# Patient Record
Sex: Male | Born: 1945
Health system: Southern US, Community
[De-identification: ages and names within clinical notes are randomized; demographics above are authoritative.]

## PROBLEM LIST (undated history)

## (undated) DIAGNOSIS — F1010 Alcohol abuse, uncomplicated: Secondary | ICD-10-CM

## (undated) DIAGNOSIS — R1310 Dysphagia, unspecified: Secondary | ICD-10-CM

## (undated) DIAGNOSIS — E43 Unspecified severe protein-calorie malnutrition: Secondary | ICD-10-CM

## (undated) DIAGNOSIS — J690 Pneumonitis due to inhalation of food and vomit: Secondary | ICD-10-CM

---

## 2013-05-18 ENCOUNTER — Inpatient Hospital Stay
Admission: RE | Admit: 2013-05-18 | Discharge: 2013-07-02 | Disposition: A | Discharge status: Skilled Nursing Facility | Payer: Medicare Other | Source: Other Acute Inpatient Hospital | Attending: Internal Medicine | Admitting: Internal Medicine

## 2013-05-18 DIAGNOSIS — Z9911 Dependence on respirator [ventilator] status: Secondary | ICD-10-CM

## 2013-05-18 DIAGNOSIS — E43 Unspecified severe protein-calorie malnutrition: Secondary | ICD-10-CM

## 2013-05-18 DIAGNOSIS — S270XXA Traumatic pneumothorax, initial encounter: Secondary | ICD-10-CM

## 2013-05-18 DIAGNOSIS — J90 Pleural effusion, not elsewhere classified: Secondary | ICD-10-CM

## 2013-05-18 DIAGNOSIS — F1010 Alcohol abuse, uncomplicated: Secondary | ICD-10-CM | POA: Diagnosis present

## 2013-05-18 DIAGNOSIS — J9620 Acute and chronic respiratory failure, unspecified whether with hypoxia or hypercapnia: Secondary | ICD-10-CM | POA: Diagnosis present

## 2013-05-18 DIAGNOSIS — Z930 Tracheostomy status: Secondary | ICD-10-CM

## 2013-05-18 DIAGNOSIS — R29898 Other symptoms and signs involving the musculoskeletal system: Secondary | ICD-10-CM | POA: Diagnosis present

## 2013-05-18 DIAGNOSIS — J690 Pneumonitis due to inhalation of food and vomit: Secondary | ICD-10-CM | POA: Diagnosis present

## 2013-05-18 HISTORY — DX: Pneumonitis due to inhalation of food and vomit: J69.0

## 2013-05-18 HISTORY — DX: Unspecified severe protein-calorie malnutrition: E43

## 2013-05-18 HISTORY — DX: Alcohol abuse, uncomplicated: F10.10

## 2013-05-18 HISTORY — DX: Dysphagia, unspecified: R13.10

## 2013-05-19 ENCOUNTER — Institutional Professional Consult (permissible substitution) (HOSPITAL_COMMUNITY): Payer: Medicare Other

## 2013-05-19 LAB — BASIC METABOLIC PANEL
BUN: 13 mg/dL (ref 6–23)
CALCIUM: 9.1 mg/dL (ref 8.4–10.5)
CO2: 37 meq/L — AB (ref 19–32)
Chloride: 97 mEq/L (ref 96–112)
Creatinine, Ser: 0.4 mg/dL — ABNORMAL LOW (ref 0.50–1.35)
GFR calc Af Amer: >90 mL/min (ref >90)
GFR calc non Af Amer: >90 mL/min (ref >90)
Glucose, Bld: 113 mg/dL — ABNORMAL HIGH (ref 70–99)
Potassium: 3.9 mEq/L (ref 3.7–5.3)
Sodium: 141 mEq/L (ref 137–147)

## 2013-05-19 LAB — CBC
HEMATOCRIT: 38.7 % — AB (ref 39.0–52.0)
Hemoglobin: 12.6 g/dL — ABNORMAL LOW (ref 13.0–17.0)
MCH: 32.2 pg (ref 26.0–34.0)
MCHC: 32.6 g/dL (ref 30.0–36.0)
MCV: 99 fL (ref 78.0–100.0)
PLATELETS: 229 10*3/uL (ref 150–400)
RBC: 3.91 MIL/uL — ABNORMAL LOW (ref 4.22–5.81)
RDW: 13 % (ref 11.5–15.5)
WBC: 4.8 10*3/uL (ref 4.0–10.5)

## 2013-05-19 LAB — TSH: TSH: 1.67 u[IU]/mL (ref 0.350–4.500)

## 2013-05-19 NOTE — Progress Notes (Signed)
[facility]                                                                                              Progress note     Patient Demographics  Brandon Skinner Wortley, is a 68 y.o. male  ZOX:[id number]  WJX:[id number]:[id number]  DOB - 03/26/1945  Admit date - 05/18/2013  Admitting Physician Carron CurieAli Arthuro Canelo, MD  Outpatient Primary MD for the patient is PROVIDER NOT IN SYSTEM  LOS - 1   CC   Resp Failure   Dysphagia         Subjective:   Brandon Skinner Kahan today has, No headache, No chest pain, + abdominal pain - No Nausea, No new weakness tingling or numbness, No Cough - SOB. + Insomnia  Objective:   Vital signs  Temperature Heart rate Respiratory rate Blood pressure Pulse ox    Exam Awake Alert, Oriented X 3, No new F.N deficits, Normal affect [place].AT,PERRAL NGT In Place Supple Neck,No JVD, No cervical lymphadenopathy appriciated.  Symmetrical Chest wall movement, Good air movement bilaterally, CTAB RRR,No Gallops,Rubs or new Murmurs, No Parasternal Heave +ve B.Sounds, Abd Soft, Periumbilical Tenderness, No organomegaly appriciated, No rebound - guarding or rigidity. No Cyanosis, Clubbing or edema, No new Rash or bruise    I&Os NG   Data Review   CBC  Recent Labs Lab 05/19/13 0446  WBC 4.8  HGB 12.6*  HCT 38.7*  PLT 229  MCV 99.0  MCH 32.2  MCHC 32.6  RDW 13.0    Chemistries   Recent Labs Lab 05/19/13 0446  NA 141  K 3.9  CL 97  CO2 37*  GLUCOSE 113*  BUN 13  CREATININE 0.40*  CALCIUM 9.1        Assessment & Plan   Resp Failure B/L Asp Pna ; on Levaquin and Flagyl Dysphagia Hypernatremia Deconditioning Alcoholism PCM Insomnia  Code Status: Full    DVT Prophylaxis Heparin   Carron CurieAli Chelsie Burel M.D on 05/19/2013 at 3:39 PM

## 2013-05-20 ENCOUNTER — Other Ambulatory Visit (HOSPITAL_COMMUNITY): Payer: Medicare Other

## 2013-05-20 ENCOUNTER — Encounter: Payer: Self-pay | Admitting: Pulmonary Disease

## 2013-05-20 DIAGNOSIS — F1010 Alcohol abuse, uncomplicated: Secondary | ICD-10-CM

## 2013-05-20 DIAGNOSIS — Z9911 Dependence on respirator [ventilator] status: Secondary | ICD-10-CM

## 2013-05-20 DIAGNOSIS — R29898 Other symptoms and signs involving the musculoskeletal system: Secondary | ICD-10-CM | POA: Diagnosis present

## 2013-05-20 DIAGNOSIS — E43 Unspecified severe protein-calorie malnutrition: Secondary | ICD-10-CM

## 2013-05-20 DIAGNOSIS — J690 Pneumonitis due to inhalation of food and vomit: Secondary | ICD-10-CM | POA: Diagnosis present

## 2013-05-20 DIAGNOSIS — J9620 Acute and chronic respiratory failure, unspecified whether with hypoxia or hypercapnia: Secondary | ICD-10-CM | POA: Diagnosis present

## 2013-05-20 DIAGNOSIS — R29818 Other symptoms and signs involving the nervous system: Secondary | ICD-10-CM

## 2013-05-20 LAB — BLOOD GAS, ARTERIAL
Acid-Base Excess: 10.2 mmol/L — ABNORMAL HIGH (ref 0.0–2.0)
Bicarbonate: 36.3 mEq/L — ABNORMAL HIGH (ref 20.0–24.0)
FIO2: 0.5 %
O2 Saturation: 88.4 %
PH ART: 7.333 — AB (ref 7.350–7.450)
PO2 ART: 58.4 mmHg — AB (ref 80.0–100.0)
Patient temperature: 98.6
TCO2: 38.4 mmol/L (ref 0–100)
pCO2 arterial: 70.3 mmHg (ref 35.0–45.0)

## 2013-05-20 NOTE — Procedures (Signed)
Intubation Procedure Note Mickel FuchsFredrick L Consalvo [id number] [birth year]  Procedure: Intubation Indications: Respiratory insufficiency  Procedure Details Consent: Risks of procedure as well as the alternatives and risks of each were explained to the (patient/caregiver).  Consent for procedure obtained. Time Out: Verified patient identification, verified procedure, site/side was marked, verified correct patient position, special equipment/implants available, medications/allergies/relevent history reviewed, required imaging and test results available.  Performed  Maximum sterile technique was used including antiseptics, gloves, hand hygiene and mask.  MAC and 3  Given 4 mg versed, 50 mcg fentanyl, 10 mg etomidate.  Used glidescope.  Inserted #8 ETT to 25 cm.  Evaluation Hemodynamic Status: BP stable throughout; O2 sats: stable throughout Patient's Current Condition: stable Complications: No apparent complications Patient did tolerate procedure well. Chest X-ray ordered to verify placement.  CXR: pending.   Performed by Devra DoppSteve Minor, ACNP.  I was present for procedure.  Coralyn HellingVineet Trinty Marken, MD [facility] 05/20/2013, 9:47 AM Pager:  [phone] After 3pm call: [phone]

## 2013-05-20 NOTE — Consult Note (Signed)
Name: Brandon Skinner MRN: [id number] DOB: 1945/09/20    ADMISSION DATE:  05/18/2013 CONSULTATION DATE:  05/21/2103  REFERRING MD :  Carron CurieAli Hijazi  CHIEF COMPLAINT:  Short of breath  BRIEF PATIENT DESCRIPTION:  68 yr old male with hx of TOH was [facility].  He was transferred to medical facility with hypoxia and pneumonia likely from aspiration with dysphagia.  [facility]  PCCM consulted 05/20/2013 due to respiratory failure.  SIGNIFICANT EVENTS: 4/19 [facility] VDRF  STUDIES:   LINES / TUBES: ETT 4/21 >>   CULTURES: Sputum 4/21 >>   ANTIBIOTICS: Levaquin  HISTORY OF PRESENT ILLNESS:   68 yo male with hx of ETOH went to [facility] for detox.  He was noted to have dysphagia.  He developed hypoxia from aspiration.  [facility]  He developed recurrent aspiration with progressive respiratory failure.  He was tried on BiPAP, but this did not help.  He subsequently required intubation.  PAST MEDICAL HISTORY :  Past Medical History  Diagnosis Date  . ETOH abuse   . Severe protein-calorie malnutrition   . Dysphagia   . Aspiration pneumonia    No past surgical history on file.  Med list reviewed in bedside chart.  Allergies  Allergen Reactions  . Penicillins     FAMILY HISTORY:  Family History  Problem Relation Age of Onset  . Family history unknown: Yes   SOCIAL HISTORY:  reports that he drinks alcohol. His tobacco and drug histories are not on file.  REVIEW OF SYSTEMS:   Unable to obtain  SUBJECTIVE:   VITAL SIGNS: Reviewed in bedside chart.  PHYSICAL EXAMINATION: General:  Ill appearing,  cachectic Neuro:  Confused, moves extremities, mumbles HEENT:  Temporal wasting, feeding tube in nares, dry oral mucosa Cardiovascular:  Regular, tachycardic Lungs:  B/l rhonchi, poor air movement, using accessory muscles Abdomen:  Soft, thin, decreased bowel sounds Musculoskeletal:  Decreased muscle bulk Skin:   No rash  CBC Recent Labs     05/19/13  0446  WBC  4.8  HGB  12.6*  HCT  38.7*  PLT  229    BMET Recent Labs     05/19/13  0446  NA  141  K  3.9  CL  97  CO2  37*  BUN  13  CREATININE  0.40*  GLUCOSE  113*    Electrolytes Recent Labs     05/19/13  0446  CALCIUM  9.1    ABG Recent Labs     05/20/13  0617  PHART  7.333*  PCO2ART  70.3*  PO2ART  58.4*   Imaging Dg Chest Port 1 View  05/19/2013   CLINICAL DATA:  Respiratory failure  EXAM: PORTABLE CHEST - 1 VIEW  COMPARISON:  DG CHEST 1V dated 05/17/2013; CT ANGIO CHEST dated 05/12/2013  FINDINGS: The lungs are hyperinflated likely secondary to COPD. There is a enteric tube coursing below the diaphragm with the tip excluded from the field of view. Bilateral trace pleural effusions, left greater than right. Bibasilar airspace disease, left greater than right which may reflect atelectasis versus pneumonia. Stable cardiomediastinal silhouette.  The osseous structures are unremarkable.  IMPRESSION: Stable overall findings. Bilateral small pleural effusions and bibasilar airspace disease which may reflect atelectasis versus pneumonia.   Electronically Signed   By: Elige KoHetal  Patel   On: 05/19/2013 07:59   Dg Abd Portable 1v  05/20/2013   CLINICAL DATA:  Check feeding tube position.  EXAM: PORTABLE ABDOMEN - 1 VIEW  COMPARISON:  05/16/2013  FINDINGS: Feeding tube tip is in the upper mid abdomen consistent with location in the distal stomach. Bowel gas pattern is normal with residual contrast and stool in the colon.  IMPRESSION: Feeding tube tip localizes to the lobe distal stomach.   Electronically Signed   By: Burman NievesWilliam  Stevens M.D.   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 P: -full vent support -Abx per primary team -add scheduled BD's -f/u CXR, ABG -sedation protocol while on vent  A: Severe protein  calorie malnutrition. Dysphagia. P: -continue tube feeds >> was recommend to have PEG inserted at outside hospital -protonix for SUP  A: Hx of ETOH. P: -per primary team  A: Deconditioning. P: -PT/OT per primary team  Spoke with Pt's daughter Lorelle FormosaHanna over the phone.  Updated her about her father's status.  She confirmed that her father requested aggressive measures, including intubation and cardiac resuscitation if needed.  D/w Dr. Sharyon MedicusHijazi.  CC time 60 minutes.  Coralyn HellingVineet Laira Penninger, MD [facility] 05/20/2013, 10:09 AM Pager:  [phone] After 3pm call: [phone]

## 2013-05-20 NOTE — Progress Notes (Signed)
[facility]                                                                                              Progress note     Patient Demographics  Brandon Skinner, is a 68 y.o. male  WNU:[id number]  IHK:[id number]  DOB - 30-Sep-1945  Admit date - 05/18/2013  Admitting Physician Carron CurieAli Emmer Lillibridge, MD  Outpatient Primary MD for the patient is PROVIDER NOT IN SYSTEM  LOS - 2   CC   Resp Failure   Dysphagia     Aspiration pneumonia       Subjective:   Brandon Skinner unable to give any history  Objective:   Vital signs  Temperature 97.6 Heart rate 83 Respiratory rate 18  Blood pressure 125/75 Pulse ox 98%    Exam Obtunded ,  [place].AT,PERRAL NGT In Place Supple Neck,No JVD, No cervical lymphadenopathy appriciated. ET tube in place Symmetrical Chest wall movement, decreased breath sounds bilaterally especially at the bases RRR,No Gallops,Rubs or new Murmurs, No Parasternal Heave +ve B.Sounds, Abd Soft, Periumbilical Tenderness, No organomegaly appriciated, No rebound - guarding or rigidity. No Cyanosis, Clubbing or edema, No new Rash or bruise    I&Os 1350/40 NG yes   Data Review   CBC  Recent Labs Lab 05/19/13 0446  WBC 4.8  HGB 12.6*  HCT 38.7*  PLT 229  MCV 99.0  MCH 32.2  MCHC 32.6  RDW 13.0    Chemistries   Recent Labs Lab 05/19/13 0446  NA 141  K 3.9  CL 97  CO2 37*  GLUCOSE 113*  BUN 13  CREATININE 0.40*  CALCIUM 9.1        Assessment & Plan   Resp Failure, now intubated continue with TV first 20, respiratory rate 16 FiO2 100% and PEEP of 5. B/L Asp Pna/HCAPS ; on vancomycin and Zosyn  Dysphagia continue with NG tube feeding Hypernatremia resolved Deconditioning hold PT OT today and resume in a.m. Alcoholism continue with thiamine/folate PCM continue with tube feeding and NG tube Insomnia continue with Ativan at night  Plan Check labs and  chest x-ray in a.m. And vancomycin IV for HCAPS  Code Status: Full    DVT Prophylaxis Heparin   Carron CurieAli Lamoyne Palencia M.D on 05/20/2013 at 1:38 PM

## 2013-05-21 ENCOUNTER — Other Ambulatory Visit (HOSPITAL_COMMUNITY): Payer: Medicare Other

## 2013-05-21 LAB — BLOOD GAS, ARTERIAL
Acid-Base Excess: 7.8 mmol/L — ABNORMAL HIGH (ref 0.0–2.0)
Acid-Base Excess: 7 mmol/L — ABNORMAL HIGH (ref 0.0–2.0)
Bicarbonate: 30.8 mEq/L — ABNORMAL HIGH (ref 20.0–24.0)
Bicarbonate: 30.6 mEq/L — ABNORMAL HIGH (ref 20.0–24.0)
FIO2: 60 %
FIO2: 0.4 %
LHR: 16 {breaths}/min
MECHVT: 620 mL
MECHVT: 550 mL
O2 SAT: 99.9 %
O2 SAT: 96.7 %
PCO2 ART: 35.5 mmHg (ref 35.0–45.0)
PCO2 ART: 40.3 mmHg (ref 35.0–45.0)
PEEP/CPAP: 5 cmH2O
PEEP/CPAP: 5 cmH2O
PO2 ART: 79.3 mmHg — AB (ref 80.0–100.0)
Patient temperature: 98.7
Patient temperature: 98.6
RATE: 16 resp/min
TCO2: 31.9 mmol/L (ref 0–100)
TCO2: 31.8 mmol/L (ref 0–100)
pH, Arterial: 7.548 — ABNORMAL HIGH (ref 7.350–7.450)
pH, Arterial: 7.492 — ABNORMAL HIGH (ref 7.350–7.450)
pO2, Arterial: 116 mmHg — ABNORMAL HIGH (ref 80.0–100.0)

## 2013-05-21 LAB — CBC
HEMATOCRIT: 33.3 % — AB (ref 39.0–52.0)
HEMOGLOBIN: 11.3 g/dL — AB (ref 13.0–17.0)
MCH: 32.2 pg (ref 26.0–34.0)
MCHC: 33.9 g/dL (ref 30.0–36.0)
MCV: 94.9 fL (ref 78.0–100.0)
Platelets: 243 10*3/uL (ref 150–400)
RBC: 3.51 MIL/uL — AB (ref 4.22–5.81)
RDW: 13.2 % (ref 11.5–15.5)
WBC: 12.6 10*3/uL — ABNORMAL HIGH (ref 4.0–10.5)

## 2013-05-21 LAB — BASIC METABOLIC PANEL
BUN: 20 mg/dL (ref 6–23)
CALCIUM: 8.6 mg/dL (ref 8.4–10.5)
CO2: 27 meq/L (ref 19–32)
CREATININE: 0.58 mg/dL (ref 0.50–1.35)
Chloride: 101 mEq/L (ref 96–112)
GFR calc Af Amer: >90 mL/min (ref >90)
GFR calc non Af Amer: >90 mL/min (ref >90)
GLUCOSE: 124 mg/dL — AB (ref 70–99)
Potassium: 3.6 mEq/L — ABNORMAL LOW (ref 3.7–5.3)
Sodium: 139 mEq/L (ref 137–147)

## 2013-05-21 NOTE — Progress Notes (Signed)
[facility]                                                                                              Progress note     Patient Demographics  Brandon Skinner, is a 68 y.o. male  AVW:[id number]  WGN:[id number]  DOB - 1945/07/04  Admit date - 05/18/2013  Admitting Physician Carron CurieAli Jim Philemon, MD  Outpatient Primary MD for the patient is PROVIDER NOT IN SYSTEM  LOS - 3   CC   Resp Failure   Dysphagia     Aspiration pneumonia       Subjective:   Brandon CoryFredrick Mikkelson unable to give any history due to to intubation   Objective:   Vital signs  Temperature 97.6 Heart rate  85  Respiratory rate  60   Blood pressure  116/65  Pulse ox  96%     Exam Obtunded ,  [place].AT,PERRAL NGT In Place Supple Neck,No JVD, No cervical lymphadenopathy appriciated. ET tube in place Symmetrical Chest wall movement, decreased breath sounds bilaterally especially at the bases RRR,No Gallops,Rubs or new Murmurs, No Parasternal Heave +ve B.Sounds, Abd Soft, Periumbilical Tenderness, No organomegaly appriciated, No rebound - guarding or rigidity. No Cyanosis, Clubbing or edema, No new Rash or bruise    I&Os  2462/850  NG yes   Data Review   CBC  Recent Labs Lab 05/19/13 0446 05/21/13 0700  WBC 4.8 12.6*  HGB 12.6* 11.3*  HCT 38.7* 33.3*  PLT 229 243  MCV 99.0 94.9  MCH 32.2 32.2  MCHC 32.6 33.9  RDW 13.0 13.2    Chemistries   Recent Labs Lab 05/19/13 0446 05/21/13 0700  NA 141 139  K 3.9 3.6*  CL 97 101  CO2 37* 27  GLUCOSE 113* 124*  BUN 13 20  CREATININE 0.40* 0.58  CALCIUM 9.1 8.6        Assessment & Plan   Resp Failure, now intubated continue with ET tube per PCCM B/L Asp Pna/HCAPS ; on vancomycin and Zosyn  Dysphagia continue with NG tube feeding Hypernatremia resolved Deconditioning hold PT OT today and resume in a.m. Alcoholism continue with thiamine/folate PCM continue  with tube feeding and NG tube Insomnia continue with Ativan at night  Plan  DC vancomycin and Unasyn  Check labs portable chest x-ray in a.m.  Code Status: Full    DVT Prophylaxis Heparin   Carron CurieAli Jeree Delcid M.D on 05/21/2013 at 6:21 PM

## 2013-05-21 NOTE — Consult Note (Signed)
Name: Brandon FuchsFredrick L Skinner MRN: [id number] DOB: August 26, 1945    ADMISSION DATE:  05/18/2013 CONSULTATION DATE:  05/21/2103  REFERRING MD :  Carron CurieAli Hijazi  CHIEF COMPLAINT:  Short of breath  BRIEF PATIENT DESCRIPTION:  68 yr old male with hx of TOH [facility]  He was transferred to medical facility with hypoxia and pneumonia likely from aspiration with dysphagia.  [facility]  PCCM consulted 05/20/2013 due to respiratory failure.  SIGNIFICANT EVENTS: 4[facility]19 [facility]  STUDIES:  4/13 CT chest >> changes of emphysema, RLL consolidation, b/l small effusions  LINES / TUBES: ETT 4/21 >>   CULTURES: Sputum 4/21 >>   ANTIBIOTICS: Per Primary team  SUBJECTIVE:  Remains of full vent support  VITAL SIGNS: Reviewed in bedside chart.  PHYSICAL EXAMINATION: General:  Ill appearing,  cachectic Neuro:  Confused, moves extremities, mumbles HEENT:  Temporal wasting, feeding tube in nares, dry oral mucosa Cardiovascular:  Regular, tachycardic Lungs:  B/l rhonchi, poor air movement, using accessory muscles Abdomen:  Soft, thin, decreased bowel sounds Musculoskeletal:  Decreased muscle bulk Skin:  No rash  CBC Recent Labs     05/19/13  0446  05/21/13  0700  WBC  4.8  12.6*  HGB  12.6*  11.3*  HCT  38.7*  33.3*  PLT  229  243    BMET Recent Labs     05/19/13  0446  05/21/13  0700  NA  141  139  K  3.9  3.6*  CL  97  101  CO2  37*  27  BUN  13  20  CREATININE  0.40*  0.58  GLUCOSE  113*  124*    Electrolytes Recent Labs     05/19/13  0446  05/21/13  0700  CALCIUM  9.1  8.6    ABG Recent Labs     05/20/13  0617  05/21/13  0500  PHART  7.333*  7.548*  PCO2ART  70.3*  35.5  PO2ART  58.4*  116.0*   Imaging Dg Chest Port 1 View  05/21/2013   CLINICAL DATA:  Respiratory failure.  EXAM: PORTABLE CHEST - 1 VIEW  COMPARISON:  DG CHEST 1V PORT dated 05/20/2013  FINDINGS: Endotracheal tube and feeding tube in  stable position. Mediastinum and hilar structures are unremarkable. Persistent left lower lobe atelectasis present. Heart size normal. Mild left mid lung and right lower lobe infiltrates cannot be excluded. No prominent pleural effusion. No pneumothorax. No acute osseous abnormality.  IMPRESSION: 1. Line and tube position stable. 2. Persistent dense left lower lobe atelectasis. 3. Cannot exclude mild infiltrate left perihilar region and right lower lobe.   Electronically Signed   By: Maisie Fushomas  Register   On: 05/21/2013 08:17   Dg Chest Port 1 View  05/20/2013   CLINICAL DATA:  ET tube placement.  EXAM: PORTABLE CHEST - 1 VIEW  COMPARISON:  DG CHEST 1V PORT dated 05/19/2013 .  FINDINGS: Endotracheal tube has been placed and lies 4.5 cm above the carina. There is progressive left lower lobe atelectasis with effusion. COPD noted. Feeding tube traverses the esophagus.  IMPRESSION: ET tube 4.5 cm above carina.  Worsening aeration.   Electronically Signed   By: Davonna BellingJohn  Curnes M.D.   On: 05/20/2013 10:15   Dg Abd Portable 1v  05/20/2013   CLINICAL DATA:  Check feeding tube position.  EXAM: PORTABLE ABDOMEN - 1 VIEW  COMPARISON:  05/16/2013  FINDINGS: Feeding  tube tip is in the upper mid abdomen consistent with location in the distal stomach. Bowel gas pattern is normal with residual contrast and stool in the colon.  IMPRESSION: Feeding tube tip localizes to the lobe distal stomach.   Electronically Signed   By: Burman NievesWilliam  Stevens M.D.   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 P: -full vent support >> decrease RR to avoid over ventilation -Abx per primary team >> don't think he needs both unasyn and zosyn >> defer choice to primary team -continue scheduled BD's for now -f/u CXR, ABG -sedation protocol while on vent  A: Severe protein calorie malnutrition. Dysphagia. P: -continue tube feeds  >> was recommend to have PEG inserted at outside hospital -protonix for SUP  A: Hx of ETOH. P: -per primary team  A: Deconditioning. P: -PT/OT per primary team  Summary: Not ready to start vent weaning yet.  If no improvement of LLL on CXR, then may need repeat CT chest and then ?bronchoscopy.  CC time 35 minutes.  Coralyn HellingVineet Skylarr Liz, MD [facility] 05/21/2013, 9:24 AM Pager:  [phone] After 3pm call: [phone]

## 2013-05-22 ENCOUNTER — Other Ambulatory Visit (HOSPITAL_COMMUNITY): Payer: Medicare Other

## 2013-05-22 NOTE — Progress Notes (Addendum)
Name: Brandon Skinner MRN: [id number] DOB: 1945-07-22    ADMISSION DATE:  05/18/2013 CONSULTATION DATE:  05/21/2103  REFERRING MD :  Carron CurieAli Hijazi  CHIEF COMPLAINT:  Short of breath  BRIEF PATIENT DESCRIPTION:  68 yr old male with hx of TOH [facility]  He was transferred to medical facility with hypoxia and pneumonia likely from aspiration with dysphagia.  [facility]  PCCM consulted 05/20/2013 due to respiratory failure.  SIGNIFICANT EVENTS: [facility]  STUDIES:  4/13 CT chest >> changes of emphysema, RLL consolidation, b/l small effusions  LINES / TUBES: ETT 4/21 >>  PICC 4/21>>  CULTURES: Sputum 4/21 >>   ANTIBIOTICS: Per Primary team  SUBJECTIVE:  Remains of full vent support, sedated >> did not tolerate vent weaning.  VITAL SIGNS: Vital signs reviewed. Abnormal values will appear under impression plan section.    PHYSICAL EXAMINATION: General:  Ill appearing,  cachectic Neuro:  Sedated on vent HEENT:  Temporal wasting, feeding tube in nares, dry oral mucosa Cardiovascular:  Regular, tachycardic Lungs:  B/l rhonchi,in synch with vent Abdomen:  Soft, thin, decreased bowel sounds Musculoskeletal:  Decreased muscle bulk Skin:  No rash  CBC Recent Labs     05/21/13  0700  WBC  12.6*  HGB  11.3*  HCT  33.3*  PLT  243    BMET Recent Labs     05/21/13  0700  NA  139  K  3.6*  CL  101  CO2  27  BUN  20  CREATININE  0.58  GLUCOSE  124*    Electrolytes Recent Labs     05/21/13  0700  CALCIUM  8.6    ABG Recent Labs     05/20/13  0617  05/21/13  0500  05/21/13  0920  PHART  7.333*  7.548*  7.492*  PCO2ART  70.3*  35.5  40.3  PO2ART  58.4*  116.0*  79.3*   Imaging Dg Chest Port 1 View  05/22/2013   CLINICAL DATA:  PNEUMONIA  EXAM: PORTABLE CHEST - 1 VIEW  COMPARISON:  DG CHEST 1V PORT dated 05/21/2013  FINDINGS: The heart size and mediastinal contours are within normal  limits. Persistent consolidative density left lung base. There is volume loss within the left hemi thorax particularly the lung base. No further focal regions of consolidation or focal infiltrates. There is hyperinflation of the right lung.  Endotracheal tube tip 5.2 cm above the carina. Left-sided PICC line tip at the level superior vena caval right atrial junction. Enteric feeding tube tip not view on the study.  No acute osseous abnormalities.  IMPRESSION: Support lines and tubes adequately position  Persistent atelectasis versus consolidative infiltrate left lung base   Electronically Signed   By: Salome HolmesHector  Cooper M.D.   On: 05/22/2013 08:24   Dg Chest Port 1 View  05/21/2013   CLINICAL DATA:  Left arm PICC line placement  EXAM: PORTABLE CHEST - 1 VIEW  COMPARISON:  Portable exam 1543 hr compared to 0646 hr  FINDINGS: Tip of endotracheal tube projects 5.4 cm above carinal.  Feeding tube extends into stomach.  Left arm PICC line tip projects over cavoatrial junction.  Stable heart size and mediastinal contours.  Persistent atelectasis versus consolidation of left lower lobe.  Minimal right base atelectasis.  Right lung hyperinflated and otherwise clear.  Probable small left pleural effusion.  No pneumothorax.  IMPRESSION: Persistent atelectasis versus consolidation left  lower lobe.  Minimal right base atelectasis.  Tip of left arm PICC line projects over cavoatrial junction.   Electronically Signed   By: Ulyses SouthwardMark  Boles M.D.   On: 05/21/2013 16:00   Dg Chest Port 1 View  05/21/2013   CLINICAL DATA:  Respiratory failure.  EXAM: PORTABLE CHEST - 1 VIEW  COMPARISON:  DG CHEST 1V PORT dated 05/20/2013  FINDINGS: Endotracheal tube and feeding tube in stable position. Mediastinum and hilar structures are unremarkable. Persistent left lower lobe atelectasis present. Heart size normal. Mild left mid lung and right lower lobe infiltrates cannot be excluded. No prominent pleural effusion. No pneumothorax. No acute osseous  abnormality.  IMPRESSION: 1. Line and tube position stable. 2. Persistent dense left lower lobe atelectasis. 3. Cannot exclude mild infiltrate left perihilar region and right lower lobe.   Electronically Signed   By: Maisie Fushomas  Register   On: 05/21/2013 08:17   Dg Chest Port 1 View  05/20/2013   CLINICAL DATA:  ET tube placement.  EXAM: PORTABLE CHEST - 1 VIEW  COMPARISON:  DG CHEST 1V PORT dated 05/19/2013 .  FINDINGS: Endotracheal tube has been placed and lies 4.5 cm above the carina. There is progressive left lower lobe atelectasis with effusion. COPD noted. Feeding tube traverses the esophagus.  IMPRESSION: ET tube 4.5 cm above carina.  Worsening aeration.   Electronically Signed   By: Davonna BellingJohn  Curnes M.D.   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 P: -full vent support >> decrease RR to avoid over ventilation -Abx per primary team on V/Z -continue scheduled BD's for now -f/u CXR, ABG -sedation protocol while on vent -may need bronch to assess LLL  A: Severe protein calorie malnutrition. Dysphagia. P: -continue tube feeds >> was recommend to have PEG inserted at outside hospital -protonix for SUP  A: Hx of ETOH. P: -per primary team  A: Deconditioning. P: -PT/OT per primary team  Summary: Not ready to start vent weaning yet.  If no improvement of LLL on CXR, then may need repeat CT chest and then ? Bronchoscopy. Consider early tach.   Brett CanalesSteve Minor ACNP [facility] Pager [phone] till 3 pm If no answer page [phone] 05/22/2013, 9:22 AM  Reviewed above, and examined.  Cc time 35 minutes.  Coralyn HellingVineet Elliet Goodnow, MD [facility] 05/22/2013, 9:53 AM Pager:  [phone] After 3pm call: [phone]

## 2013-05-22 NOTE — Procedures (Signed)
Bronchoscopy Procedure Note Brandon FuchsFredrick L Skinner [id number] [birth year]  Procedure: Bronchoscopy Indications: Diagnostic evaluation of the airways  Procedure Details Consent: Risks of procedure as well as the alternatives and risks of each were explained to the (patient/caregiver).  Consent for procedure obtained. Time Out: Verified patient identification, verified procedure, site/side was marked, verified correct patient position, special equipment/implants available, medications/allergies/relevent history reviewed, required imaging and test results available.  Performed  Procedure done at bedside.  Placed on 100% FiO2.  Given 4 mg versed, 100 mcg fentanyl for sedation/analgesia.    Bronchoscope entered through ETT.  Carina visualized.    Rt main bronchus entered.  Rt upper, middle, lower lobes visualized.  Minimal clear to yellow secretions.  No endobronchial lesions.  Lt main bronchus entered.  Lt upper, lingular, and lower lobes visualized.  Thick, yellow secretions obstructing Lt lower lobe.  With saline instillation and brisk suction airway cleared.  No endobronchial lesions.  Instilled 60 ml of saline to LLL with 15 ml of cloudy white to yellow fluid returned.  No complications.  No bleeding.  Bronchoscope withdrawal.  Oxygenation and hemodynamics stable during procedure.  Plan Will send LLL BAL for culture Continue bronchodilator therapy, antibiotics, bronchial hygiene  Performed with assistance of Steve Minor, ACNP.  I was present for entire procedure.  Coralyn HellingVineet Khelani Kops, MD [facility] 05/22/2013, 10:50 AM Pager:  [phone] After 3pm call: [phone]

## 2013-05-23 LAB — CULTURE, RESPIRATORY: Special Requests: NORMAL

## 2013-05-23 LAB — VANCOMYCIN, TROUGH: Vancomycin Tr: <5 ug/mL — ABNORMAL LOW (ref 10.0–20.0)

## 2013-05-23 NOTE — Progress Notes (Addendum)
Name: Brandon Skinner MRN: [id number] DOB: 1945-09-15    ADMISSION DATE:  05/18/2013 CONSULTATION DATE:  05/21/2103  REFERRING MD :  Carron CurieAli Hijazi  CHIEF COMPLAINT:  Short of breath  BRIEF PATIENT DESCRIPTION:  68 yr old male with hx of TOH [facility]  He was transferred to medical facility with hypoxia and pneumonia likely from aspiration with dysphagia.  [facility]  PCCM consulted 05/20/2013 due to respiratory failure.  SIGNIFICANT EVENTS: [facility]  STUDIES:   LINES / TUBES: ETT 4/21 >>   CULTURES: Sputum 4/21 >>   ANTIBIOTICS: Levaquin  HISTORY OF PRESENT ILLNESS:   68 yo male with hx of ETOH went to [facility] for detox.  He was noted to have dysphagia.  He developed hypoxia from aspiration.  [facility]  He developed recurrent aspiration with progressive respiratory failure.  He was tried on BiPAP, but this did not help.  He subsequently required intubation. SUBJECTIVE:  NAD VITAL SIGNS: Reviewed in bedside chart.  PHYSICAL EXAMINATION: General:  Ill appearing,  Cachectic, agitated at times Neuro:  Moves spont. sedated HEENT:  Temporal wasting, feeding tube in nares, dry oral mucosa Cardiovascular:  Regular, tachycardic Lungs:  B/l rhonchi, diminished in bases Abdomen:  Soft, thin, decreased bowel sounds Musculoskeletal:  Decreased muscle bulk Skin:  No rash  CBC Recent Labs     05/21/13  0700  WBC  12.6*  HGB  11.3*  HCT  33.3*  PLT  243    BMET Recent Labs     05/21/13  0700  NA  139  K  3.6*  CL  101  CO2  27  BUN  20  CREATININE  0.58  GLUCOSE  124*    Electrolytes Recent Labs     05/21/13  0700  CALCIUM  8.6    ABG Recent Labs     05/21/13  0500  05/21/13  0920  PHART  7.548*  7.492*  PCO2ART  35.5  40.3  PO2ART  116.0*  79.3*   Imaging Dg Chest Port 1 View  05/22/2013   CLINICAL DATA:  PNEUMONIA  EXAM: PORTABLE CHEST - 1 VIEW  COMPARISON:  DG CHEST  1V PORT dated 05/21/2013  FINDINGS: The heart size and mediastinal contours are within normal limits. Persistent consolidative density left lung base. There is volume loss within the left hemi thorax particularly the lung base. No further focal regions of consolidation or focal infiltrates. There is hyperinflation of the right lung.  Endotracheal tube tip 5.2 cm above the carina. Left-sided PICC line tip at the level superior vena caval right atrial junction. Enteric feeding tube tip not view on the study.  No acute osseous abnormalities.  IMPRESSION: Support lines and tubes adequately position  Persistent atelectasis versus consolidative infiltrate left lung base   Electronically Signed   By: Salome HolmesHector  Cooper M.D.   On: 05/22/2013 08:24   Dg Chest Port 1 View  05/21/2013   CLINICAL DATA:  Left arm PICC line placement  EXAM: PORTABLE CHEST - 1 VIEW  COMPARISON:  Portable exam 1543 hr compared to 0646 hr  FINDINGS: Tip of endotracheal tube projects 5.4 cm above carinal.  Feeding tube extends into stomach.  Left arm PICC line tip projects over cavoatrial junction.  Stable heart size and mediastinal contours.  Persistent atelectasis versus consolidation of left lower lobe.  Minimal right base atelectasis.  Right lung hyperinflated and  otherwise clear.  Probable small left pleural effusion.  No pneumothorax.  IMPRESSION: Persistent atelectasis versus consolidation left lower lobe.  Minimal right base atelectasis.  Tip of left arm PICC line projects over cavoatrial junction.   Electronically Signed   By: Ulyses SouthwardMark  Boles M.D.   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 P: -full vent support -Abx per primary team -add scheduled BD's -f/u CXR, ABG -sedation protocol while on vent  A: Severe protein calorie malnutrition. Dysphagia. P: -continue tube feeds >> was recommend to have PEG inserted at  outside hospital -protonix for SUP  A: Hx of ETOH. P: -per primary team  A: Deconditioning. P: -PT/OT per primary team  Brett CanalesSteve Minor ACNP [facility] Pager [phone] till 3 pm If no answer page [phone] 05/23/2013, 12:36 PM  Reviewed above and examined.  Continue pressure support wean.  Continue abx.  F/u BAL results.  Coralyn HellingVineet Jonuel Butterfield, MD [facility] Pul[facility]/Critical [facility] 05/23/2013, 2:53 PM Pager:  [phone] After 3pm call: [phone]

## 2013-05-24 ENCOUNTER — Other Ambulatory Visit (HOSPITAL_COMMUNITY): Payer: Medicare Other

## 2013-05-24 LAB — BASIC METABOLIC PANEL
BUN: 14 mg/dL (ref 6–23)
CO2: 31 mEq/L (ref 19–32)
CREATININE: 0.44 mg/dL — AB (ref 0.50–1.35)
Calcium: 8.4 mg/dL (ref 8.4–10.5)
Chloride: 98 mEq/L (ref 96–112)
GFR calc Af Amer: >90 mL/min (ref >90)
GFR calc non Af Amer: >90 mL/min (ref >90)
GLUCOSE: 122 mg/dL — AB (ref 70–99)
Potassium: 3.9 mEq/L (ref 3.7–5.3)
Sodium: 138 mEq/L (ref 137–147)

## 2013-05-24 LAB — CBC
HEMATOCRIT: 29.3 % — AB (ref 39.0–52.0)
HEMOGLOBIN: 9.8 g/dL — AB (ref 13.0–17.0)
MCH: 32.3 pg (ref 26.0–34.0)
MCHC: 33.4 g/dL (ref 30.0–36.0)
MCV: 96.7 fL (ref 78.0–100.0)
Platelets: 288 10*3/uL (ref 150–400)
RBC: 3.03 MIL/uL — AB (ref 4.22–5.81)
RDW: 13.4 % (ref 11.5–15.5)
WBC: 11.9 10*3/uL — ABNORMAL HIGH (ref 4.0–10.5)

## 2013-05-24 LAB — CULTURE, BAL-QUANTITATIVE
COLONY COUNT: NO GROWTH
Culture: NO GROWTH

## 2013-05-24 LAB — CULTURE, BAL-QUANTITATIVE W GRAM STAIN

## 2013-05-24 LAB — PRO B NATRIURETIC PEPTIDE: Pro B Natriuretic peptide (BNP): 151.2 pg/mL — ABNORMAL HIGH (ref 0–125)

## 2013-05-24 NOTE — Procedures (Signed)
Bronchoscopy Procedure Note Brandon FuchsFredrick L Skinner [id number] 19-Jun-1945  Procedure: Bronchoscopy Indications: Obtain specimens for culture and/or other diagnostic studies and Remove secretions  Procedure Details Consent: Unable to obtain consent because of emergent medical necessity. Time Out: Verified patient identification, verified procedure, site/side was marked, verified correct patient position, special equipment/implants available, medications/allergies/relevent history reviewed, required imaging and test results available.  Performed  In preparation for procedure, patient was given 100% FiO2 and bronchoscope lubricated. Sedation: Benzodiazepines and fentayl  Airway entered and the following bronchi were examined: RUL, RML, RLL, LUL, LLL and Bronchi.   Bronchoscope removed.    Left main is completely obstructed with thick secretions that were all removed.  BAL from the LLL.  Evaluation Hemodynamic Status: BP stable throughout; O2 sats: stable throughout Patient's Current Condition: stable Specimens:  Sent purulent fluid Complications: No apparent complications Patient did tolerate procedure well.   Alyson ReedyWesam G Yacoub 05/24/2013

## 2013-05-24 NOTE — Progress Notes (Signed)
   Name: Brandon FuchsFredrick L Skinner MRN: [id number] DOB: May 29, 1945    ADMISSION DATE:  05/18/2013 CONSULTATION DATE:  05/21/2103  REFERRING MD :  Carron CurieAli Hijazi  CHIEF COMPLAINT:  Short of breath  BRIEF PATIENT DESCRIPTION:  68 yr old male with hx of TOH [facility]  He was transferred to medical facility with hypoxia and pneumonia likely from aspiration with dysphagia.  [facility]  PCCM consulted 05/20/2013 due to respiratory failure.  SIGNIFICANT EVENTS: [facility]  STUDIES:   LINES / TUBES: ETT 4/21 >>   CULTURES: Sputum 4/21 >>   ANTIBIOTICS: Levaquin  SUBJECTIVE:  NAD  VITAL SIGNS: Reviewed in bedside chart.  PHYSICAL EXAMINATION: General:  Ill appearing,  Cachectic, agitated at times Neuro:  Moves spont. sedated HEENT:  Temporal wasting, feeding tube in nares, dry oral mucosa Cardiovascular:  Regular, tachycardic Lungs:  B/l rhonchi, diminished in bases Abdomen:  Soft, thin, decreased bowel sounds Musculoskeletal:  Decreased muscle bulk Skin:  No rash  CBC Recent Labs     05/24/13  0800  WBC  11.9*  HGB  9.8*  HCT  29.3*  PLT  288    BMET Recent Labs     05/24/13  0800  NA  138  K  3.9  CL  98  CO2  31  BUN  14  CREATININE  0.44*  GLUCOSE  122*    Electrolytes Recent Labs     05/24/13  0800  CALCIUM  8.4    ABG No results found for this basename: PHART, PCO2ART, PO2ART,  in the last 72 hours Imaging Dg Chest Port 1 View  05/24/2013   CLINICAL DATA:  Bronchoscopy.  EXAM: PORTABLE CHEST - 1 VIEW  COMPARISON:  05/22/2013.  FINDINGS: There is new complete opacification of the left hemithorax, presumably status post bronchoscopy. Endotracheal tube is present with the tip 41 mm from the carina. There is leftward mediastinal shift and volume loss within the left hemithorax. Enteric tube and left upper extremity PICC appear unchanged. Feeding tube is present extending at least to the abdomen.  Right lower lobe consolidation is again visualized, similar to prior.  IMPRESSION: 1. Stable support apparatus. Endotracheal tube tip 41 mm from the carina. 2. New complete opacification of the left hemithorax with volume loss, likely due to mucous plugging. 3. Persistent consolidation in the right lower lobe.   Electronically Signed   By: Andreas NewportGeoffrey  Lamke M.D.   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  Complete collapse of left lung. P: - Full vent support, may start PS trials but no extubation. - Abx per primary team. - Bronch with samples today. - Add scheduled BD's. - F/U CXR, ABG. - Sedation protocol while on vent. - Will need to discuss trach option with the family as I do not foresee him coming off the vent.  A: Severe protein calorie malnutrition. Dysphagia. P: - Continue tube feeds. - Protonix for SUP.  A: Hx of ETOH. P: - Per primary team  A: Deconditioning. P: - PT/OT per primary team  CC time 35 min.  Alyson ReedyWesam G. Yacoub, M.D. [facility]/Critical [facility] Medicine. Pager: [phone]. After hours pager: [phone].

## 2013-05-25 ENCOUNTER — Other Ambulatory Visit (HOSPITAL_COMMUNITY): Payer: Medicare Other

## 2013-05-25 LAB — BLOOD GAS, ARTERIAL
ACID-BASE EXCESS: 9.5 mmol/L — AB (ref 0.0–2.0)
BICARBONATE: 34.6 meq/L — AB (ref 20.0–24.0)
FIO2: 0.4 %
LHR: 12 {breaths}/min
MECHVT: 550 mL
O2 Saturation: 98.5 %
PATIENT TEMPERATURE: 98.6
PEEP/CPAP: 0.5 cmH2O
PO2 ART: 108 mmHg — AB (ref 80.0–100.0)
TCO2: 36.4 mmol/L (ref 0–100)
pCO2 arterial: 57.6 mmHg (ref 35.0–45.0)
pH, Arterial: 7.396 (ref 7.350–7.450)

## 2013-05-25 LAB — BASIC METABOLIC PANEL
BUN: 16 mg/dL (ref 6–23)
CALCIUM: 8.5 mg/dL (ref 8.4–10.5)
CO2: 32 meq/L (ref 19–32)
Chloride: 96 mEq/L (ref 96–112)
Creatinine, Ser: 0.43 mg/dL — ABNORMAL LOW (ref 0.50–1.35)
GFR calc Af Amer: >90 mL/min (ref >90)
GLUCOSE: 115 mg/dL — AB (ref 70–99)
POTASSIUM: 4.7 meq/L (ref 3.7–5.3)
Sodium: 135 mEq/L — ABNORMAL LOW (ref 137–147)

## 2013-05-25 LAB — CBC
HCT: 29.8 % — ABNORMAL LOW (ref 39.0–52.0)
HEMOGLOBIN: 9.9 g/dL — AB (ref 13.0–17.0)
MCH: 32.6 pg (ref 26.0–34.0)
MCHC: 33.2 g/dL (ref 30.0–36.0)
MCV: 98 fL (ref 78.0–100.0)
Platelets: 321 10*3/uL (ref 150–400)
RBC: 3.04 MIL/uL — AB (ref 4.22–5.81)
RDW: 13.5 % (ref 11.5–15.5)
WBC: 15.1 10*3/uL — ABNORMAL HIGH (ref 4.0–10.5)

## 2013-05-25 LAB — PHENYTOIN LEVEL, TOTAL: Phenytoin Lvl: 9.2 ug/mL — ABNORMAL LOW (ref 10.0–20.0)

## 2013-05-26 LAB — BLOOD GAS, ARTERIAL
Acid-Base Excess: 9.5 mmol/L — ABNORMAL HIGH (ref 0.0–2.0)
BICARBONATE: 34.2 meq/L — AB (ref 20.0–24.0)
FIO2: 0.3 %
LHR: 14 {breaths}/min
MECHVT: 550 mL
O2 SAT: 98.2 %
PCO2 ART: 53.9 mmHg — AB (ref 35.0–45.0)
PEEP: 5 cmH2O
Patient temperature: 99.1
TCO2: 35.8 mmol/L (ref 0–100)
pH, Arterial: 7.42 (ref 7.350–7.450)
pO2, Arterial: 104 mmHg — ABNORMAL HIGH (ref 80.0–100.0)

## 2013-05-26 LAB — PHENYTOIN LEVEL, TOTAL: PHENYTOIN LVL: 3.5 ug/mL — AB (ref 10.0–20.0)

## 2013-05-26 NOTE — Progress Notes (Signed)
Name: Brandon Skinner MRN: [id number] DOB: [birth year]    ADMISSION DATE:  05/18/2013 CONSULTATION DATE:  05/21/2103  REFERRING MD :  Carron CurieAli Hijazi  CHIEF COMPLAINT:  Short of breath  BRIEF PATIENT DESCRIPTION:  68 yr old male with hx of ETOH was [facility]  He was transferred to medical facility with hypoxia and pneumonia likely from aspiration with dysphagia.  [facility]  PCCM consulted 05/20/2013 due to respiratory failure.  SIGNIFICANT EVENTS: [facility]  STUDIES:   LINES / TUBES: ETT 4/21 >>   CULTURES: Sputum 4/21 >>   ANTIBIOTICS: Levaquin  SUBJECTIVE:  NAD  VITAL SIGNS: Reviewed in bedside chart.  PHYSICAL EXAMINATION: General:  Ill appearing,  Cachectic, agitated at times Neuro:  Moves spont. sedated HEENT:  Temporal wasting, feeding tube in nares, dry oral mucosa Cardiovascular:  Regular, tachycardic Lungs:  B/l rhonchi, diminished in bases Abdomen:  Soft, thin, decreased bowel sounds Musculoskeletal:  Decreased muscle bulk Skin:  No rash  CBC Recent Labs     05/24/13  0800  05/25/13  0603  WBC  11.9*  15.1*  HGB  9.8*  9.9*  HCT  29.3*  29.8*  PLT  288  321    BMET Recent Labs     05/24/13  0800  05/25/13  0603  NA  138  135*  K  3.9  4.7  CL  98  96  CO2  31  32  BUN  14  16  CREATININE  0.44*  0.43*  GLUCOSE  122*  115*    Electrolytes Recent Labs     05/24/13  0800  05/25/13  0603  CALCIUM  8.4  8.5    ABG Recent Labs     05/25/13  1259  05/26/13  0500  PHART  7.396  7.420  PCO2ART  57.6*  53.9*  PO2ART  108.0*  104.0*   Imaging Ct Head Wo Contrast  05/25/2013   CLINICAL DATA:  Ethanol abuse.  Seizure.  EXAM: CT HEAD WITHOUT CONTRAST  TECHNIQUE: Contiguous axial images were obtained from the base of the skull through the vertex without intravenous contrast.  COMPARISON:  05/12/2013  FINDINGS: The brain shows generalized atrophy. There is no evidence of focal  wall old or acute infarction, mass lesion, hemorrhage, hydrocephalus or extra-axial collection. No calvarial abnormality. Sinuses, middle ears and mastoids are clear.  IMPRESSION: Atrophy.  No focal or acute finding.   Electronically Signed   By: Paulina FusiMark  Shogry M.D.   On: 05/25/2013 11:12   Dg Chest Port 1 View  05/24/2013   CLINICAL DATA:  Aspiration pneumonia.  EXAM: PORTABLE CHEST - 1 VIEW  COMPARISON:  DG CHEST 1V PORT dated 05/24/2013  FINDINGS: Endotracheal tube is positioned 4.3 cm from carina. Feeding tube extends to the stomach. Feeding tube extends below the margin film in the course of the stomach.  There is marked improvement in the left hemi thorax opacification with near complete resolution of the dense atelectasis. . There is a PICC line with tip in the cavoatrial junction. Mild atelectasis at the right lung base.  IMPRESSION: 1. Marked improvement in the dense left atelectasis and effusion. Near complete resolution. 2. Stable support apparatus.   Electronically Signed   By: Genevive BiStewart  Edmunds M.D.   On: 05/24/2013 14:57   Dg Chest Port 1 View  05/24/2013   CLINICAL DATA:  Bronchoscopy.  EXAM: PORTABLE CHEST - 1 VIEW  COMPARISON:  05/22/2013.  FINDINGS: There is new complete opacification of the left hemithorax, presumably status post bronchoscopy. Endotracheal tube is present with the tip 41 mm from the carina. There is leftward mediastinal shift and volume loss within the left hemithorax. Enteric tube and left upper extremity PICC appear unchanged. Feeding tube is present extending at least to the abdomen. Right lower lobe consolidation is again visualized, similar to prior.  IMPRESSION: 1. Stable support apparatus. Endotracheal tube tip 41 mm from the carina. 2. New complete opacification of the left hemithorax with volume loss, likely due to mucous plugging. 3. Persistent consolidation in the right lower lobe.   Electronically Signed   By: Andreas NewportGeoffrey  Lamke M.D.   On: 05/24/2013 13:01  bilateral  airspace disease. Improved aeration s/p bronch. 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 -plan for trach 4/28, have d/w family daughter, he would accept trach under these circumstances -abx started 4/27 (vanc/ceftazA) - Protonix for SUP. -abg reviewed, keep same MV  Severe protein calorie malnutrition. Dysphagia. Hx of ETOH Deconditioning. plan - PT/OT per primary team -cont nutritional support  Anders SimmondsPete Babcock ACNP-BC [facility] Pager # [phone] OR # [phone] if no answer  Ccm time 30 min '  I have fully examined this patient and agree with above findings.    And edite dinfull  Mcarthur Rossettianiel J. Tyson AliasFeinstein, MD, FACP Pgr: [phone] [place] [facility] & Critical [facility]

## 2013-05-26 NOTE — Progress Notes (Signed)
[facility]                                                                                              Progress note     Patient Demographics  Brandon Skinner, is a 68 y.o. male  HYQ:[id number]:[id number]  XBM:[id number]  DOB - 07/29/1945  Admit date - 05/18/2013  Admitting Physician Carron CurieAli Troye Hiemstra, MD  Outpatient Primary MD for the patient is PROVIDER NOT IN SYSTEM  LOS - 8   CC   Resp Failure   Dysphagia     Aspiration pneumonia       Subjective:   Brandon CoryFredrick Skinner unable to give any history due to to intubation   Objective:   Vital signs  Temperature 97.6 Heart rate  85  Respiratory rate  60   Blood pressure  116/65  Pulse ox  96%     Exam Obtunded ,  [place].AT,PERRAL NGT In Place Supple Neck,No JVD, No cervical lymphadenopathy appriciated. ET tube in place Symmetrical Chest wall movement, decreased breath sounds bilaterally especially at the bases RRR,No Gallops,Rubs or new Murmurs, No Parasternal Heave +ve B.Sounds, Abd Soft, Periumbilical Tenderness, No organomegaly appriciated, No rebound - guarding or rigidity. No Cyanosis, Clubbing or edema, No new Rash or bruise    I&Os  2462/850  NG yes   Data Review   CBC  Recent Labs Lab 05/21/13 0700 05/24/13 0800 05/25/13 0603  WBC 12.6* 11.9* 15.1*  HGB 11.3* 9.8* 9.9*  HCT 33.3* 29.3* 29.8*  PLT 243 288 321  MCV 94.9 96.7 98.0  MCH 32.2 32.3 32.6  MCHC 33.9 33.4 33.2  RDW 13.2 13.4 13.5    Chemistries   Recent Labs Lab 05/21/13 0700 05/24/13 0800 05/25/13 0603  NA 139 138 135*  K 3.6* 3.9 4.7  CL 101 98 96  CO2 27 31 32  GLUCOSE 124* 122* 115*  BUN 20 14 16   CREATININE 0.58 0.44* 0.43*  CALCIUM 8.6 8.4 8.5        Assessment & Plan   Resp Failure, now intubated continue with ET tube per PCCM for  tracheostomy B/L Asp Pna/HCAPS ; s/p iv abxs Dysphagia continue with NG tube feeding Hypernatremia  resolved Deconditioning hold PT OT today and resume in a.m. Alcoholism continue with thiamine/folate PCM continue with tube feeding and NG tube   Plan  Restart IV Abxs, Vancomycin since culture is growing gram-positive cocci but no ID yet   DVT Prophylaxis Heparin   Carron CurieAli Stacy Sailer M.D on 05/26/2013 at 3:35 PM

## 2013-05-27 ENCOUNTER — Other Ambulatory Visit (HOSPITAL_COMMUNITY): Payer: Medicare Other

## 2013-05-27 ENCOUNTER — Encounter (HOSPITAL_COMMUNITY): Payer: Medicare Other

## 2013-05-27 LAB — BASIC METABOLIC PANEL
BUN: 13 mg/dL (ref 6–23)
CALCIUM: 8.6 mg/dL (ref 8.4–10.5)
CHLORIDE: 95 meq/L — AB (ref 96–112)
CO2: 31 meq/L (ref 19–32)
Creatinine, Ser: 0.4 mg/dL — ABNORMAL LOW (ref 0.50–1.35)
GFR calc Af Amer: >90 mL/min (ref >90)
GFR calc non Af Amer: >90 mL/min (ref >90)
GLUCOSE: 94 mg/dL (ref 70–99)
POTASSIUM: 3.8 meq/L (ref 3.7–5.3)
Sodium: 134 mEq/L — ABNORMAL LOW (ref 137–147)

## 2013-05-27 LAB — PROTIME-INR
INR: 1.11 (ref 0.00–1.49)
PROTHROMBIN TIME: 14.1 s (ref 11.6–15.2)

## 2013-05-27 LAB — CULTURE, BAL-QUANTITATIVE

## 2013-05-27 LAB — CULTURE, BAL-QUANTITATIVE W GRAM STAIN: Colony Count: 30000

## 2013-05-27 NOTE — Progress Notes (Signed)
Name: Brandon FuchsFredrick L Skinner MRN: [id number] DOB: March 23, 1945    ADMISSION DATE:  05/18/2013 CONSULTATION DATE:  05/21/2103  REFERRING MD :  Carron CurieAli Hijazi  CHIEF COMPLAINT:  Short of breath  BRIEF PATIENT DESCRIPTION:  68 yr old male with hx of ETOH [facility].  He was transferred to medical facility with hypoxia and pneumonia likely from aspiration with dysphagia.  [facility]  PCCM consulted 05/20/2013 due to respiratory failure.  SIGNIFICANT EVENTS: [facility]  STUDIES:   LINES / TUBES: ETT 4/21 >> 4/28 Trach (df) 4/28>>>  CULTURES: Sputum 4/21 >>  BAl bronch 4/28>>>  ANTIBIOTICS: 4/27 ceftaz>>> 4/27 vanc>>>  SUBJECTIVE:  NAD  VITAL SIGNS: Reviewed in bedside chart.  PHYSICAL EXAMINATION: General:  Ill appearing,  Cachectic, agitated at times Neuro: pre trach rasss -1 HEENT:  Temporal wasting, feeding tube in nares, dry oral mucosa Cardiovascular:  Regular, tachycardic Lungs:  ronchi diffuse Abdomen:  Soft, thin, decreased bowel sounds Musculoskeletal:  Decreased muscle bulk Skin:  No rash  CBC Recent Labs     05/25/13  0603  WBC  15.1*  HGB  9.9*  HCT  29.8*  PLT  321    BMET Recent Labs     05/25/13  0603  05/27/13  0710  NA  135*  134*  K  4.7  3.8  CL  96  95*  CO2  32  31  BUN  16  13  CREATININE  0.43*  0.40*  GLUCOSE  115*  94    Electrolytes Recent Labs     05/25/13  0603  05/27/13  0710  CALCIUM  8.5  8.6    ABG Recent Labs     05/25/13  1259  05/26/13  0500  PHART  7.396  7.420  PCO2ART  57.6*  53.9*  PO2ART  108.0*  104.0*   Imaging Dg Chest Port 1 View  05/27/2013   CLINICAL DATA:  Respiratory failure.  EXAM: PORTABLE CHEST - 1 VIEW  COMPARISON:  Single view of the chest 05/24/2013. CT chest 05/13/2011.  FINDINGS: Support tubes and lines are unchanged. There has been interval worsening in aeration in the right mid and lower lung zones compatible with increased  effusion and airspace disease. Airspace disease in the left lower lung zones appears unchanged. No pneumothorax is identified.  IMPRESSION: Marked increase in right mid and lower lung zone airspace disease and likely small right effusion.  No change in left basilar airspace disease.   Electronically Signed   By: Drusilla Kannerhomas  Dalessio M.D.   On: 05/27/2013 08:23  bilateral airspace disease. Improved aeration s/p bronch. 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 Dysphagia. Hx of ETOH Deconditioning. plan - PT/OT per primary  team -cont nutritional support -unclear if will need peg, assess tc prior to this  Ccm time 30 min '  I have fully examined this patient and agree with above findings.    And edite dinfull  Mcarthur Rossettianiel J. Tyson AliasFeinstein, MD, FACP Pgr: [phone] [place] [facility]

## 2013-05-27 NOTE — Procedures (Signed)
Perc trach  Consent daughter Aware risks death, bleeding, ptx, infection Pre op dx: pna, collapse, ARF  Post op dx: pna, trach ,vent dep  coags , plat wnl prior  Bronch by Dr Molli Knockyacoub observed entire procedure  chrlorprep over 3 its Lido plus epi 7 cc injected,  vert incision 1.2 cm Dissection to tracheal planes, straps easily through Placed 18 gauge needle, over white cath sheeth, then removed needle Place wire dirct observed no imjury Placed punch dil 14, then progressive rhno to 27 fr over glider Then trach 8 over 28 All removed except trach sututured into place 4 mono fil sutures  broch shows trach wnl, no bleeding  Blood loss less 1 cc  Tolerated well  Mcarthur Rossettianiel J. Tyson AliasFeinstein, MD, FACP Pgr: [phone] [place] [facility]

## 2013-05-27 NOTE — Procedures (Signed)
Bedside Tracheostomy Insertion Procedure Note   Patient Details:   Name: Brandon Skinner DOB: 10-Feb-1945 MRN: [id number]  Procedure: Tracheostomy  Pre Procedure Assessment: Bite block in place: Yes Breath Sounds: Clear  Post Procedure Assessment: Ht 6' (1.829 m) O2 sats: stable throughout Complications: No apparent complications Patient did tolerate procedure well Tracheostomy Brand:Shiley Tracheostomy Style:Cuffed Tracheostomy Size: 8.0 Tracheostomy Secured RJJ:OACZYSAvia:Sutures Tracheostomy Placement Confirmation:Trach cuff visualized and in place and Chest X ray ordered for placement    Leonard DowningKristin Steele Ferguson 05/27/2013, 11:46 AM

## 2013-05-27 NOTE — Progress Notes (Signed)
[facility]                                                                                              Progress note     Patient Demographics  Brandon Skinner, is a 68 y.o. male  ZOX:[id number]  WJX:[id number]  DOB - [birth year]  Admit date - 05/18/2013  Admitting Physician Carron CurieAli Nesha Counihan, MD  Outpatient Primary MD for the patient is PROVIDER NOT IN SYSTEM  LOS - 9   CC   Resp Failure   Dysphagia     Aspiration pneumonia       Subjective:   Brandon Skinner underwent successful tracheostomy placement today   Objective:   Vital signs  Temperature 99.9 Heart rate  71  Respiratory rate  16  Blood pressure  101/79  Pulse ox  97%     Exam Obtunded ,  [place].AT,PERRAL NGT In Place Supple Neck,No JVD, No cervical lymphadenopathy appriciated. ET tube in place Symmetrical Chest wall movement, decreased breath sounds bilaterally especially at the bases RRR,No Gallops,Rubs or new Murmurs, No Parasternal Heave +ve B.Sounds, Abd Soft, Periumbilical Tenderness, No organomegaly appriciated, No rebound - guarding or rigidity. No Cyanosis, Clubbing or edema, No new Rash or bruise    I&Os  1870/950  NG yes   Data Review   CBC  Recent Labs Lab 05/21/13 0700 05/24/13 0800 05/25/13 0603  WBC 12.6* 11.9* 15.1*  HGB 11.3* 9.8* 9.9*  HCT 33.3* 29.3* 29.8*  PLT 243 288 321  MCV 94.9 96.7 98.0  MCH 32.2 32.3 32.6  MCHC 33.9 33.4 33.2  RDW 13.2 13.4 13.5    Chemistries   Recent Labs Lab 05/21/13 0700 05/24/13 0800 05/25/13 0603 05/27/13 0710  NA 139 138 135* 134*  K 3.6* 3.9 4.7 3.8  CL 101 98 96 95*  CO2 27 31 32 31  GLUCOSE 124* 122* 115* 94  BUN 20 14 16 13   CREATININE 0.58 0.44* 0.43* 0.40*  CALCIUM 8.6 8.4 8.5 8.6        Assessment & Plan   Resp Failure, status post tracheostomy tube placement today without problems B/L Asp Pna/HCAPS ; C/W IV abxs Dysphagia  continue with NG tube feeding Hypernatremia resolved Deconditioning PT OT*. Alcoholism continue with thiamine/folate and Librium PCM continue with tube feeding and NG tube   Plan  Continue with IV antibiotics Check labs in a.m. DC Celexa DC Ativan Start melatonin 5 mg each bedtime   DVT Prophylaxis Heparin   Carron CurieAli Natalie Leclaire M.D on 05/27/2013 at 2:43 PM

## 2013-05-27 NOTE — Procedures (Signed)
Bronchoscopy Procedure Note Brandon FuchsFredrick L Skinner [id number] 09-13-1945  Procedure: Bronchoscopy Indications: Diagnostic evaluation of the airways and Remove secretions  Procedure Details Consent: Risks of procedure as well as the alternatives and risks of each were explained to the (patient/caregiver).  Consent for procedure obtained. Time Out: Verified patient identification, verified procedure, site/side was marked, verified correct patient position, special equipment/implants available, medications/allergies/relevent history reviewed, required imaging and test results available.  Performed  In preparation for procedure, patient was given 100% FiO2 and bronchoscope lubricated. Sedation: Benzodiazepines and Etomidate  Airway entered and the following bronchi were examined: RUL, RML, RLL, LUL, LLL and Bronchi.   Bronchoscope removed.    Evaluation Hemodynamic Status: BP stable throughout; O2 sats: stable throughout Patient's Current Condition: stable Specimens:  Sent purulent fluid Complications: No apparent complications Patient did tolerate procedure well.   Brandon ReedyWesam G Skinner 05/27/2013

## 2013-05-28 ENCOUNTER — Other Ambulatory Visit (HOSPITAL_COMMUNITY): Payer: Medicare Other

## 2013-05-28 LAB — CBC
HCT: 29.3 % — ABNORMAL LOW (ref 39.0–52.0)
Hemoglobin: 9.8 g/dL — ABNORMAL LOW (ref 13.0–17.0)
MCH: 31.7 pg (ref 26.0–34.0)
MCHC: 33.4 g/dL (ref 30.0–36.0)
MCV: 94.8 fL (ref 78.0–100.0)
Platelets: 334 10*3/uL (ref 150–400)
RBC: 3.09 MIL/uL — ABNORMAL LOW (ref 4.22–5.81)
RDW: 13.3 % (ref 11.5–15.5)
WBC: 5.3 10*3/uL (ref 4.0–10.5)

## 2013-05-28 LAB — BASIC METABOLIC PANEL
BUN: 14 mg/dL (ref 6–23)
CALCIUM: 8.5 mg/dL (ref 8.4–10.5)
CO2: 30 meq/L (ref 19–32)
CREATININE: 0.35 mg/dL — AB (ref 0.50–1.35)
Chloride: 99 mEq/L (ref 96–112)
GFR calc non Af Amer: >90 mL/min (ref >90)
Glucose, Bld: 114 mg/dL — ABNORMAL HIGH (ref 70–99)
Potassium: 3.5 mEq/L — ABNORMAL LOW (ref 3.7–5.3)
Sodium: 138 mEq/L (ref 137–147)

## 2013-05-28 NOTE — Progress Notes (Signed)
Name: Brandon Skinner MRN: [id number] DOB: 1945-04-25    ADMISSION DATE:  05/18/2013 CONSULTATION DATE:  05/21/2103  REFERRING MD :  Carron CurieAli Hijazi  CHIEF COMPLAINT:  Short of breath  BRIEF PATIENT DESCRIPTION:  68 yr old male with hx of ETOH [facility]  He was transferred to medical facility with hypoxia and pneumonia likely from aspiration with dysphagia.  [facility]  PCCM consulted 05/20/2013 due to respiratory failure.  SIGNIFICANT EVENTS: [facility]  STUDIES:   LINES / TUBES: ETT 4/21 >> 4/28 Trach (df) 4/28>>>  CULTURES: Sputum 4/21 >>  BAl bronch 4/28>>>  ANTIBIOTICS: 4/27 ceftaz>>> 4/27 vanc>>>  SUBJECTIVE:  NAD  VITAL SIGNS: Reviewed in bedside chart.  PHYSICAL EXAMINATION: General:  Ill appearing,  Cachectic, agitated at times Neuro: pre trach rasss -1 HEENT:  Temporal wasting, feeding tube in nares, dry oral mucosa Cardiovascular:  Regular, tachycardic Lungs:  ronchi diffuse some accessory muscle use w/ weaning efforts.  Abdomen:  Soft, thin, decreased bowel sounds Musculoskeletal:  Decreased muscle bulk Skin:  No rash  CBC Recent Labs     05/28/13  0540  WBC  5.3  HGB  9.8*  HCT  29.3*  PLT  334    BMET Recent Labs     05/27/13  0710  05/28/13  0540  NA  134*  138  K  3.8  3.5*  CL  95*  99  CO2  31  30  BUN  13  14  CREATININE  0.40*  0.35*  GLUCOSE  94  114*    Electrolytes Recent Labs     05/27/13  0710  05/28/13  0540  CALCIUM  8.6  8.5    ABG Recent Labs     05/25/13  1259  05/26/13  0500  PHART  7.396  7.420  PCO2ART  57.6*  53.9*  PO2ART  108.0*  104.0*   Imaging Dg Chest Port 1 View  05/28/2013   CLINICAL DATA:  Respiratory failure  EXAM: PORTABLE CHEST - 1 VIEW  COMPARISON:  Portable chest x-ray of 05/27/2013  FINDINGS: There is little change in haziness at both lung bases most consistent with atelectasis and effusions. Heart size is stable.  Tracheostomy and left PICC line remain. Tracheostomy tube remains. A feeding tube extends below the hemidiaphragm.  IMPRESSION: Little change in bibasilar haziness most consistent with atelectasis and effusions.   Electronically Signed   By: Dwyane DeePaul  Barry M.D.   On: 05/28/2013 08:15   Dg Chest Port 1 View  05/27/2013   CLINICAL DATA:  Tracheostomy  EXAM: PORTABLE CHEST - 1 VIEW  COMPARISON:  DG CHEST 1V PORT dated 05/27/2013; DG CHEST 1V PORT dated 05/24/2013; CT ANGIO CHEST dated 05/12/2013; DG CHEST 1V dated 05/10/2013; DG CHEST 1V dated 05/13/2013; DG CHEST 1V PORT dated 05/24/2013  FINDINGS: Tracheostomy tube in good position. Stable cardiac silhouette. There is bilateral pleural effusions similar prior. Mild airspace disease and atelectasis in the right lower lobe. Rounded 17 mm focus in the left lower lobe may represent infection or atelectasis.  IMPRESSION: 1. Tracheostomy tube in good position. 2. Persistent bilateral pleural effusions and right basilar atelectasis/infiltrate.   Electronically Signed   By: Genevive BiStewart  Edmunds M.D.   On: 05/27/2013 12:32   Dg Chest Port 1 View  05/27/2013   CLINICAL DATA:  Respiratory failure.  EXAM: PORTABLE CHEST - 1 VIEW  COMPARISON:  Single view of the  chest 05/24/2013. CT chest 05/13/2011.  FINDINGS: Support tubes and lines are unchanged. There has been interval worsening in aeration in the right mid and lower lung zones compatible with increased effusion and airspace disease. Airspace disease in the left lower lung zones appears unchanged. No pneumothorax is identified.  IMPRESSION: Marked increase in right mid and lower lung zone airspace disease and likely small right effusion.  No change in left basilar airspace disease.   Electronically Signed   By: Drusilla Kannerhomas  Dalessio M.D.   On: 05/27/2013 08:23  bilateral airspace disease. Improved aeration s/p bronch. 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 D/c after PEG on 4/29 - would recommend peep stay 8-10 with such high risk recurrent collapse off pos pressure, given bronch findginsd during trach also =-could consider cpap 8, ps 5 attempts If culture neg in am would narrow to ceftriaxone  Severe protein calorie malnutrition. Dysphagia. Hx of ETOH Deconditioning. plan - PT/OT per primary team -cont nutritional support -for PEG   I have fully examined this patient and agree with above findings.    And edited infull  Ccm timr 30 min  Mcarthur Rossettianiel J. Tyson AliasFeinstein, MD, FACP Pgr: [phone] [facility]

## 2013-05-28 NOTE — Progress Notes (Signed)
[facility]                                                                                              Progress note     Patient Demographics  Brandon Skinner, is a 68 y.o. male  ZOX:[id number]  WJX:[id number]:[id number]  DOB - September 14, 1945  Admit date - 05/18/2013  Admitting Physician Brandon CurieAli Abdirahman Chittum, MD  Outpatient Primary MD for the patient is PROVIDER NOT IN SYSTEM  LOS - 10   CC   Resp Failure   Dysphagia     Aspiration pneumonia       Subjective:   Brandon Skinner underwent successful tracheostomy placement today   Objective:   Vital signs  Temperature 98.7 Heart rate  78  Respiratory rate  16  Blood pressure  120/80  Pulse ox  97%     Exam Obtunded ,  [name].AT,PERRAL NGT In Place Supple Neck,No JVD, No cervical lymphadenopathy appriciated. ET tube in place Symmetrical Chest wall movement, decreased breath sounds bilaterally especially at the bases RRR,No Gallops,Rubs or new Murmurs, No Parasternal Heave +ve B.Sounds, Abd Soft, Periumbilical Tenderness, No organomegaly appriciated, No rebound - guarding or rigidity. No Cyanosis, Clubbing or edema, No new Rash or bruise    I&Os  1520/950 NG yes   Data Review   CBC  Recent Labs Lab 05/24/13 0800 05/25/13 0603 05/28/13 0540  WBC 11.9* 15.1* 5.3  HGB 9.8* 9.9* 9.8*  HCT 29.3* 29.8* 29.3*  PLT 288 321 334  MCV 96.7 98.0 94.8  MCH 32.3 32.6 31.7  MCHC 33.4 33.2 33.4  RDW 13.4 13.5 13.3    Chemistries   Recent Labs Lab 05/24/13 0800 05/25/13 0603 05/27/13 0710 05/28/13 0540  NA 138 135* 134* 138  K 3.9 4.7 3.8 3.5*  CL 98 96 95* 99  CO2 31 32 31 30  GLUCOSE 122* 115* 94 114*  BUN 14 16 13 14   CREATININE 0.44* 0.43* 0.40* 0.35*  CALCIUM 8.4 8.5 8.6 8.5        Assessment & Plan   Resp Failure, status post tracheostomy tube placement today without problems B/L Asp Pna/HCAPS ; C/W IV abxs Dysphagia  continue with NG tube feeding Hypernatremia resolved Deconditioning PT OT*. Alcoholism continue with thiamine/folate and Librium PCM continue with tube feeding and NG tube   Plan  Continue with IV antibiotics vancomycin and Fortaz Chest PT Bronchodilators Keep PEEP elevated No IV antibiotics of Rocephin in the morning if BAL is negative Discussed with PCCM Dr. Tyson Skinner at length today   DVT Prophylaxis Heparin   Brandon CurieAli Richerd Skinner M.D on 05/28/2013 at 2:45 PM

## 2013-05-29 ENCOUNTER — Other Ambulatory Visit (HOSPITAL_COMMUNITY): Payer: Medicare Other

## 2013-05-29 LAB — CULTURE, BAL-QUANTITATIVE

## 2013-05-29 LAB — CULTURE, BAL-QUANTITATIVE W GRAM STAIN
Colony Count: 10000
Special Requests: NORMAL

## 2013-05-29 LAB — VANCOMYCIN, TROUGH: Vancomycin Tr: <5 ug/mL — ABNORMAL LOW (ref 10.0–20.0)

## 2013-05-29 NOTE — Progress Notes (Addendum)
[facility]                                                                                              Progress note     Patient Demographics  Brandon Skinner, is a 68 y.o. male  JYN:[id number]  QMV:[id number]  DOB - Jul 19, 1945  Admit date - 05/18/2013  Admitting Physician Carron CurieAli Ariauna Farabee, MD  Outpatient Primary MD for the patient is PROVIDER NOT IN SYSTEM  LOS - 11   CC   Resp Failure   Dysphagia     Aspiration pneumonia       Subjective:   Brandon CoryFredrick Demas underwent successful tracheostomy placement today   Objective:   Vital signs  Temperature 99.8 Heart rate  119 Respiratory rate  17  Blood pressure  92/65  Pulse ox  95%     Exam Obtunded ,  [place].AT,PERRAL NGT In Place Supple Neck,No JVD, No cervical lymphadenopathy appriciated. ET tube in place Symmetrical Chest wall movement, decreased breath sounds bilaterally especially at the bases RRR,No Gallops,Rubs or new Murmurs, No Parasternal Heave +ve B.Sounds, Abd Soft, Periumbilical Tenderness, No organomegaly appriciated, No rebound - guarding or rigidity. No Cyanosis, Clubbing or edema, No new Rash or bruise    I&Os  unknown NG yes   Data Review   CBC  Recent Labs Lab 05/24/13 0800 05/25/13 0603 05/28/13 0540  WBC 11.9* 15.1* 5.3  HGB 9.8* 9.9* 9.8*  HCT 29.3* 29.8* 29.3*  PLT 288 321 334  MCV 96.7 98.0 94.8  MCH 32.3 32.6 31.7  MCHC 33.4 33.2 33.4  RDW 13.4 13.5 13.3    Chemistries   Recent Labs Lab 05/24/13 0800 05/25/13 0603 05/27/13 0710 05/28/13 0540  NA 138 135* 134* 138  K 3.9 4.7 3.8 3.5*  CL 98 96 95* 99  CO2 31 32 31 30  GLUCOSE 122* 115* 94 114*  BUN 14 16 13 14   CREATININE 0.44* 0.43* 0.40* 0.35*  CALCIUM 8.4 8.5 8.6 8.5        Assessment & Plan   Resp Failure, status post tracheostomy tube placement today without problems continue with PSV trials B/L Asp Pna/HCAPS ; C/W IV  abxs Dysphagia continue with NG tube feeding Hypernatremia resolved Deconditioning PT OT*. Alcoholism continue with thiamine/folate and Librium PCM continue with tube feeding and NG tube   Plan  Change IV antibiotics to vancomycin and meropenem Check labs in a.m. Check chest x-ray in a.m. DC propofol critical care time 34 minutes  DVT Prophylaxis Heparin   Carron CurieAli Delmus Warwick M.D on 05/29/2013 at 3:16 PM

## 2013-05-30 ENCOUNTER — Other Ambulatory Visit (HOSPITAL_COMMUNITY): Payer: Medicare Other

## 2013-05-30 NOTE — Progress Notes (Signed)
[facility]                                                                                              Progress note     Patient Demographics  Brandon Skinner, is a 68 y.o. male  YQM:[id number]  BMW:[id number]  DOB - 1945/11/23  Admit date - 05/18/2013  Admitting Physician Carron CurieAli Kiyo Heal, MD  Outpatient Primary MD for the patient is PROVIDER NOT IN SYSTEM  LOS - 12   CC   Resp Failure   Dysphagia     Aspiration pneumonia       Subjective:   Brandon Skinner underwent successful tracheostomy placement today   Objective:   Vital signs  Temperature 99.1 Heart rate  96 Respiratory rate  14  Blood pressure  106/72  Pulse ox  98%     Exam Alert awake , confused [place].AT,PERRAL NGT In Place Supple Neck,No JVD, No cervical lymphadenopathy appriciated. ET tube in place Symmetrical Chest wall movement, decreased breath sounds bilaterally especially at the bases RRR,No Gallops,Rubs or new Murmurs, No Parasternal Heave +ve B.Sounds, Abd Soft, Periumbilical Tenderness, No organomegaly appriciated, No rebound - guarding or rigidity. No Cyanosis, Clubbing or edema, No new Rash or bruise    I&Os  970/350 NG yes   Data Review   CBC  Recent Labs Lab 05/24/13 0800 05/25/13 0603 05/28/13 0540  WBC 11.9* 15.1* 5.3  HGB 9.8* 9.9* 9.8*  HCT 29.3* 29.8* 29.3*  PLT 288 321 334  MCV 96.7 98.0 94.8  MCH 32.3 32.6 31.7  MCHC 33.4 33.2 33.4  RDW 13.4 13.5 13.3    Chemistries   Recent Labs Lab 05/24/13 0800 05/25/13 0603 05/27/13 0710 05/28/13 0540  NA 138 135* 134* 138  K 3.9 4.7 3.8 3.5*  CL 98 96 95* 99  CO2 31 32 31 30  GLUCOSE 122* 115* 94 114*  BUN 14 16 13 14   CREATININE 0.44* 0.43* 0.40* 0.35*  CALCIUM 8.4 8.5 8.6 8.5        Assessment & Plan   Resp Failure, status post tracheostomy tube placement today without problems continue with PSV trials B/L Asp  Pna/HCAPS ; C/W IV abxs Dysphagia continue with NG tube feeding Hypernatremia resolved Deconditioning PT OT*. Alcoholism continue with thiamine/folate and Librium PCM continue with tube feeding and NG tube   Plan  Check CBC/BMP in a.m. DC vancomycin    DVT Prophylaxis Heparin   Carron CurieAli Saralyn Willison M.D on 05/30/2013 at 2:19 PM

## 2013-05-30 NOTE — Progress Notes (Signed)
   Name: Mickel FuchsFredrick L Hove MRN: [id number] DOB: [birth year]    ADMISSION DATE:  05/18/2013 CONSULTATION DATE:  05/21/2103  REFERRING MD :  Carron CurieAli Hijazi  CHIEF COMPLAINT:  Short of breath  BRIEF PATIENT DESCRIPTION:  68 yr old male with hx of ETOH [facility]  He was transferred to medical facility with hypoxia and pneumonia likely from aspiration with dysphagia.  [facility]  PCCM consulted 05/20/2013 due to respiratory failure.  SIGNIFICANT EVENTS: [facility]  STUDIES:   LINES / TUBES: ETT 4/21 >> 4/28 Trach (df) 4/28>>>  CULTURES: Sputum 4/21 >>  BAl bronch 4/28>>>  ANTIBIOTICS: 4/27 ceftaz>>> 4/27 vanc>>>  SUBJECTIVE:  Weaning well, awake  VITAL SIGNS: Reviewed in bedside chart.  PHYSICAL EXAMINATION: General:  Ill appearing,  Cachectic, agitated at times Neuro: pre trach rasss -1 HEENT:  Temporal wasting, feeding tube in nares, dry oral mucosa Cardiovascular:  Regular, tachycardic Lungs:  ronchi diffuse some accessory muscle use w/ weaning efforts.  Abdomen:  Soft, thin, decreased bowel sounds Musculoskeletal:  Decreased muscle bulk Skin:  No rash  CBC Recent Labs     05/28/13  0540  WBC  5.3  HGB  9.8*  HCT  29.3*  PLT  334    BMET Recent Labs     05/28/13  0540  NA  138  K  3.5*  CL  99  CO2  30  BUN  14  CREATININE  0.35*  GLUCOSE  114*    Electrolytes Recent Labs     05/28/13  0540  CALCIUM  8.5    ABG No results found for this basename: PHART, PCO2ART, PO2ART,  in the last 72 hours Imaging Dg Abd Portable 1v  05/29/2013   CLINICAL DATA:  Evaluate retained barium, for gastrostomy tube placement  EXAM: PORTABLE ABDOMEN - 1 VIEW  COMPARISON:  Abdomen film of 05/20/2013  FINDINGS: There is residual barium but most of the barium is in the pelvis within colon and probably distal small bowel. No barium overlies the region of the upper abdomen. A feeding tube is present with the  tip in the region of the distal antrum of the stomach. The bowel gas pattern is nonspecific. There are probable bilateral effusions present.  IMPRESSION: 1. Residual barium overlies the bony pelvis with no barium overlying the upper abdomen. 2. Feeding tube tip in region of distal antrum of stomach.   Electronically Signed   By: Dwyane DeePaul  Barry M.D.   On: 05/29/2013 07:59  bilateral airspace disease. Improved aeration s/p bronch. 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 Tyson AliasFeinstein, MD, FACP Pgr: [phone] [facility]

## 2013-05-31 LAB — BASIC METABOLIC PANEL
BUN: 11 mg/dL (ref 6–23)
CALCIUM: 8.3 mg/dL — AB (ref 8.4–10.5)
CO2: 32 mEq/L (ref 19–32)
Chloride: 101 mEq/L (ref 96–112)
Creatinine, Ser: 0.32 mg/dL — ABNORMAL LOW (ref 0.50–1.35)
GFR calc Af Amer: >90 mL/min (ref >90)
GFR calc non Af Amer: >90 mL/min (ref >90)
Glucose, Bld: 115 mg/dL — ABNORMAL HIGH (ref 70–99)
Potassium: 4.1 mEq/L (ref 3.7–5.3)
Sodium: 141 mEq/L (ref 137–147)

## 2013-05-31 LAB — CBC
HEMATOCRIT: 26.2 % — AB (ref 39.0–52.0)
Hemoglobin: 8.5 g/dL — ABNORMAL LOW (ref 13.0–17.0)
MCH: 31.5 pg (ref 26.0–34.0)
MCHC: 32.4 g/dL (ref 30.0–36.0)
MCV: 97 fL (ref 78.0–100.0)
Platelets: 235 10*3/uL (ref 150–400)
RBC: 2.7 MIL/uL — ABNORMAL LOW (ref 4.22–5.81)
RDW: 13.4 % (ref 11.5–15.5)
WBC: 5.6 10*3/uL (ref 4.0–10.5)

## 2013-05-31 NOTE — Progress Notes (Signed)
[facility]                                                                                              Progress note     Patient Demographics  Brandon Skinner, is a 68 y.o. male  ZOX:[id number]  WJX:[id number]  DOB - April 04, 1945  Admit date - 05/18/2013  Admitting Physician Carron CurieAli Anias Bartol, MD  Outpatient Primary MD for the patient is PROVIDER NOT IN SYSTEM  LOS - 13   CC   Resp Failure   Dysphagia     Aspiration pneumonia       Subjective:   Brandon Skinner underwent successful tracheostomy placement today   Objective:   Vital signs  Temperature 97.2 Heart rate  72 Respiratory rate  14  Blood pressure  100/58 Pulse ox  98%     Exam Alert awake , confused Brandon Skinner.AT,PERRAL NGT In Place Supple Neck,No JVD, No cervical lymphadenopathy appriciated. ET tube in place Symmetrical Chest wall movement, decreased breath sounds bilaterally especially at the bases RRR,No Gallops,Rubs or new Murmurs, No Parasternal Heave +ve B.Sounds, Abd Soft, Periumbilical Tenderness, No organomegaly appriciated, No rebound - guarding or rigidity. No Cyanosis, Clubbing or edema, No new Rash or bruise    I&Os  3726/2150 NG yes   Data Review   CBC  Recent Labs Lab 05/25/13 0603 05/28/13 0540 05/31/13 0555  WBC 15.1* 5.3 5.6  HGB 9.9* 9.8* 8.5*  HCT 29.8* 29.3* 26.2*  PLT 321 334 235  MCV 98.0 94.8 97.0  MCH 32.6 31.7 31.5  MCHC 33.2 33.4 32.4  RDW 13.5 13.3 13.4    Chemistries   Recent Labs Lab 05/25/13 0603 05/27/13 0710 05/28/13 0540 05/31/13 0555  NA 135* 134* 138 141  K 4.7 3.8 3.5* 4.1  CL 96 95* 99 101  CO2 32 31 30 32  GLUCOSE 115* 94 114* 115*  BUN 16 13 14 11   CREATININE 0.43* 0.40* 0.35* 0.32*  CALCIUM 8.5 8.6 8.5 8.3*        Assessment & Plan   Resp Failure, status post tracheostomy tube placement today without problems continue with PSV trials B/L Asp  Pna/HCAPS ; C/W IV abxs Dysphagia continue with NG tube feeding Hypernatremia resolved Deconditioning PT OT*. Alcoholism continue with thiamine/folate and Librium PCM continue with tube feeding and NG tube   Plan  C/w Rx   DVT Prophylaxis Heparin   Carron CurieAli Jakota Manthei M.D on 05/31/2013 at 2:13 PM

## 2013-06-01 ENCOUNTER — Other Ambulatory Visit (HOSPITAL_COMMUNITY): Payer: Medicare Other

## 2013-06-01 LAB — BASIC METABOLIC PANEL
BUN: 11 mg/dL (ref 6–23)
CO2: 32 mEq/L (ref 19–32)
Calcium: 8.4 mg/dL (ref 8.4–10.5)
Chloride: 95 mEq/L — ABNORMAL LOW (ref 96–112)
Creatinine, Ser: 0.38 mg/dL — ABNORMAL LOW (ref 0.50–1.35)
Glucose, Bld: 105 mg/dL — ABNORMAL HIGH (ref 70–99)
POTASSIUM: 4.4 meq/L (ref 3.7–5.3)
Sodium: 136 mEq/L — ABNORMAL LOW (ref 137–147)

## 2013-06-01 LAB — CBC
HEMATOCRIT: 26.1 % — AB (ref 39.0–52.0)
Hemoglobin: 8.5 g/dL — ABNORMAL LOW (ref 13.0–17.0)
MCH: 31.6 pg (ref 26.0–34.0)
MCHC: 32.6 g/dL (ref 30.0–36.0)
MCV: 97 fL (ref 78.0–100.0)
PLATELETS: 256 10*3/uL (ref 150–400)
RBC: 2.69 MIL/uL — ABNORMAL LOW (ref 4.22–5.81)
RDW: 13.3 % (ref 11.5–15.5)
WBC: 4.5 10*3/uL (ref 4.0–10.5)

## 2013-06-01 LAB — VANCOMYCIN, TROUGH: Vancomycin Tr: 8.7 ug/mL — ABNORMAL LOW (ref 10.0–20.0)

## 2013-06-01 NOTE — Progress Notes (Signed)
Patient ID: Brandon FuchsFredrick L Skinner, male   DOB: April 16, 1945, 68 y.o.   MRN: [id number]    Perc G tube has been rescheduled from 4/30 to 5/4 Secondary spike in temp on 4/30  afeb over weekend Check labs and kub in am

## 2013-06-01 NOTE — Progress Notes (Signed)
[facility]                                                                                              Progress note     Patient Demographics  Brandon Skinner, is a 68 y.o. male  ZOX:[id number]:[id number]  WJX:[id number]:[id number]  DOB - 27-Oct-1945  Admit date - 05/18/2013  Admitting Physician Carron CurieAli Tuyet Bader, MD  Outpatient Primary MD for the patient is PROVIDER NOT IN SYSTEM  LOS - 14   CC   Resp Failure   Dysphagia     Aspiration pneumonia       Subjective:   Brandon Skinner has no new complaints  Objective:   Vital signs  Temperature 99.6 Heart rate  83 Respiratory rate  14  Blood pressure  100/74 Pulse ox  98%     Exam Alert awake , confused [place].AT,PERRAL NGT In Place Supple Neck,No JVD, No cervical lymphadenopathy appriciated. ET tube in place Symmetrical Chest wall movement, decreased breath sounds bilaterally especially at the bases RRR,No Gallops,Rubs or new Murmurs, No Parasternal Heave +ve B.Sounds, Abd Soft, Periumbilical Tenderness, No organomegaly appriciated, No rebound - guarding or rigidity. No Cyanosis, Clubbing or edema, No new Rash or bruise    I&Os  3684/2325 NG yes   Data Review   CBC  Recent Labs Lab 05/28/13 0540 05/31/13 0555 06/01/13 0745  WBC 5.3 5.6 4.5  HGB 9.8* 8.5* 8.5*  HCT 29.3* 26.2* 26.1*  PLT 334 235 256  MCV 94.8 97.0 97.0  MCH 31.7 31.5 31.6  MCHC 33.4 32.4 32.6  RDW 13.3 13.4 13.3    Chemistries   Recent Labs Lab 05/27/13 0710 05/28/13 0540 05/31/13 0555 06/01/13 0745  NA 134* 138 141 136*  K 3.8 3.5* 4.1 4.4  CL 95* 99 101 95*  CO2 31 30 32 32  GLUCOSE 94 114* 115* 105*  BUN 13 14 11 11   CREATININE 0.40* 0.35* 0.32* 0.38*  CALCIUM 8.6 8.5 8.3* 8.4        Assessment & Plan   Resp Failure, status post tracheostomy tube placement today without problems continue with PSV trials B/L Asp Pna/HCAPS ; C/W IV abxs Dysphagia  continue with NG tube feeding Hypernatremia resolved Deconditioning PT OT*. Alcoholism continue with thiamine/folate and Librium PCM continue with tube feeding and NG tube Bilateral pleural effusions  Plan  C/w Rx   DVT Prophylaxis Heparin   Carron CurieAli Garlene Apperson M.D on 06/01/2013 at 1:41 PM

## 2013-06-02 ENCOUNTER — Other Ambulatory Visit (HOSPITAL_COMMUNITY): Payer: Medicare Other

## 2013-06-02 DIAGNOSIS — J9600 Acute respiratory failure, unspecified whether with hypoxia or hypercapnia: Secondary | ICD-10-CM

## 2013-06-02 DIAGNOSIS — S270XXA Traumatic pneumothorax, initial encounter: Secondary | ICD-10-CM

## 2013-06-02 DIAGNOSIS — Z930 Tracheostomy status: Secondary | ICD-10-CM

## 2013-06-02 DIAGNOSIS — J90 Pleural effusion, not elsewhere classified: Secondary | ICD-10-CM

## 2013-06-02 LAB — BODY FLUID CELL COUNT WITH DIFFERENTIAL
Eos, Fluid: 1 %
Lymphs, Fluid: 15 %
Monocyte-Macrophage-Serous Fluid: 13 % — ABNORMAL LOW (ref 50–90)
Neutrophil Count, Fluid: 71 % — ABNORMAL HIGH (ref 0–25)
OTHER CELLS FL: REACTIVE %
Total Nucleated Cell Count, Fluid: 4150 cu mm — ABNORMAL HIGH (ref 0–1000)

## 2013-06-02 LAB — HEMATOCRIT, BODY FLUID

## 2013-06-02 LAB — LACTATE DEHYDROGENASE: LDH: 208 U/L (ref 94–250)

## 2013-06-02 LAB — PROTEIN, BODY FLUID: Total protein, fluid: 3.2 g/dL

## 2013-06-02 LAB — LACTATE DEHYDROGENASE, PLEURAL OR PERITONEAL FLUID: LD FL: 436 U/L — AB (ref 3–23)

## 2013-06-02 LAB — GLUCOSE, SEROUS FLUID: Glucose, Fluid: 99 mg/dL

## 2013-06-02 MED ORDER — MIDAZOLAM HCL 2 MG/2ML IJ SOLN
INTRAMUSCULAR | Status: AC
  Filled 2013-06-02: qty 2

## 2013-06-02 MED ORDER — GLUCAGON HCL (RDNA) 1 MG IJ SOLR
INTRAMUSCULAR | Status: AC
  Filled 2013-06-02: qty 1

## 2013-06-02 MED ORDER — FENTANYL CITRATE 0.05 MG/ML IJ SOLN
INTRAMUSCULAR | Status: AC | PRN
  Administered 2013-06-02 (×4): 50 ug via INTRAVENOUS

## 2013-06-02 MED ORDER — FENTANYL CITRATE 0.05 MG/ML IJ SOLN
INTRAMUSCULAR | Status: AC
  Filled 2013-06-02: qty 2

## 2013-06-02 MED ORDER — MIDAZOLAM HCL 2 MG/2ML IJ SOLN
INTRAMUSCULAR | Status: AC | PRN
  Administered 2013-06-02 (×2): 1 mg via INTRAVENOUS
  Administered 2013-06-02: 2 mg via INTRAVENOUS

## 2013-06-02 MED ORDER — IOHEXOL 300 MG/ML  SOLN
50.0000 mL | Freq: Once | INTRAMUSCULAR | Status: AC | PRN
  Administered 2013-06-02: 15 mL

## 2013-06-02 NOTE — Procedures (Signed)
Chest Tube Insertion Procedure Note  Indications:  Clinically significant   Pre-operative Diagnosis:   Post-operative Diagnosis: Same  Procedure Details  Informed consent was obtained for the procedure, including sedation.  Risks of lung perforation, hemorrhage, arrhythmia, and adverse drug reaction were discussed.   After sterile skin prep, using standard technique, a 28 French tube was placed in the left  5th rib space.  Findings: 400 ml of bloody seous fluid obtained  Estimated Blood Loss:  Minimal         Specimens:  Sent serosanguinous fluid              Complications:  None; patient tolerated the procedure well.         Disposition: ssh         Condition: stable  Brett CanalesSteve Minor ACNP [facility] Pager [phone] till 3 pm If no answer page [phone] 06/02/2013, 12:27 PM  Attending Attestation: I was present and scrubbed for the entire procedure.  Alyson ReedyWesam G. Yacoub, M.D. [facility]. Pager: [phone]. After hours pager: [phone].

## 2013-06-02 NOTE — Progress Notes (Signed)
Name: Brandon FuchsFredrick L Skinner MRN: [id number] DOB: 12/17/1945    ADMISSION DATE:  05/18/2013 CONSULTATION DATE:  05/21/2103  REFERRING MD :  Carron CurieAli Hijazi  CHIEF COMPLAINT:  Short of breath  BRIEF PATIENT DESCRIPTION:  68 yr old male with hx of ETOH [facility]  He was transferred to medical facility with hypoxia and pneumonia likely from aspiration with dysphagia.  [facility].  PCCM consulted 05/20/2013 due to respiratory failure.  SIGNIFICANT EVENTS: [facility]  STUDIES:   LINES / TUBES: ETT 4/21 >> 4/28 Trach (df) 4/28>>>  CULTURES: Sputum 4/21 >>  BAl bronch 4/28>>>  ANTIBIOTICS: 4/27 ceftaz>>> 4/27 vanc>>>  SUBJECTIVE:  Weaning well, awake  VITAL SIGNS: Reviewed in bedside chart.  PHYSICAL EXAMINATION: General:  Ill appearing,  Cachectic, agitated at times Neuro: pre trach rasss -1 HEENT:  Temporal wasting, feeding tube in nares, dry oral mucosa Cardiovascular:  Regular, tachycardic Lungs: no distress. Decreased on left. Scattered rhonchi  Abdomen:  Soft, thin, decreased bowel sounds Musculoskeletal:  Decreased muscle bulk Skin:  No rash  CBC Recent Labs     05/31/13  0555  06/01/13  0745  WBC  5.6  4.5  HGB  8.5*  8.5*  HCT  26.2*  26.1*  PLT  235  256    BMET Recent Labs     05/31/13  0555  06/01/13  0745  NA  141  136*  K  4.1  4.4  CL  101  95*  CO2  32  32  BUN  11  11  CREATININE  0.32*  0.38*  GLUCOSE  115*  105*    Electrolytes Recent Labs     05/31/13  0555  06/01/13  0745  CALCIUM  8.3*  8.4    ABG No results found for this basename: PHART, PCO2ART, PO2ART,  in the last 72 hours Imaging Dg Chest Port 1 View  06/01/2013   CLINICAL DATA:  Respiratory failure  EXAM: PORTABLE CHEST - 1 VIEW  COMPARISON:  05/30/2013 and prior chest radiographs  FINDINGS: The cardiomediastinal silhouette is unremarkable.  Bilateral lower lung opacities are unchanged.  Probable bilateral  pleural effusions again noted.  There is no evidence of pneumothorax or pulmonary edema.  No acute bony abnormalities are present.  Tracheostomy tube, left PICC line with tip overlying the cavoatrial junction and small bore feeding tube entering the stomach with tip off the field of view again noted.  IMPRESSION: Unchanged chest radiograph with bilateral lower lung airspace disease/consolidation/atelectasis and probable effusions.   Electronically Signed   By: Laveda AbbeJeff  Hu M.D.   On: 06/01/2013 12:15   Dg Abd Portable 1v  06/02/2013   CLINICAL DATA:  Evaluate oral contrast for gastrostomy tube placement.  EXAM: PORTABLE ABDOMEN - 1 VIEW  COMPARISON:  05/29/2013  FINDINGS: The feeding tube has been removed. There is barium throughout the colon, including the transverse colon. Persistent densities in the retrocardiac space and suspect left pleural fluid. There is no significant gas distention in the bowel loops. Residual contrast or a calcification in medial left kidney region.  IMPRESSION: The colon is well opacified with contrast.  Persistent densities at the left lung base.   Electronically Signed   By: Richarda OverlieAdam  Henn M.D.   On: 06/02/2013 07:51  bilateral airspace disease. Improved aeration. Likely mix of pna/atx. May be element of effusion on left.   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  Left sided atx/effusion.    P: -cont weaning efforts -still needs support noctrunal -abx per primary service  -Avoid sedation -diagnostic/therapeutic thoracentesis today (5/4)  Attempted thoracentesis, go air back, obvious PTX, vent changes made to avoid further leaks and patient sedated and chest tube was placed.  Would not increase PEEP or wean for the next few days.  Will see daily  until stabilized.  CC time 35 min excluding procedures.  Patient seen and examined, agree with above note.  I dictated the care and orders written for this patient under my direction.  Alyson ReedyWesam G Yacoub, MD [phone]

## 2013-06-02 NOTE — Procedures (Signed)
Successful 20 RF GTUBE No comp Stable Full report in pacs

## 2013-06-02 NOTE — Procedures (Signed)
Thoracentesis Procedure Note  Pre-operative Diagnosis: Left Pleural Effusion  Post-operative Diagnosis: same  Indications: Left Pleural Effusion  Procedure Details  Consent: Informed consent was obtained. Risks of the procedure were discussed including: infection, bleeding, pain, pneumothorax.  Under sterile conditions the patient was positioned. Betadine solution and sterile drapes were utilized.  1% buffered lidocaine was used to anesthetize the 7 rib space. Fluid was obtained without any difficulties and minimal blood loss.  A dressing was applied to the wound and wound care instructions were provided.   Findings 5 ml of cloudy pleural fluid was obtained. A sample was sent to Pathology for cytogenetics, flow, and cell counts, as well as for infection analysis.  Complications:  Was unable to remove much fluid, noted that patient likely has a PTX now.  Needle reinserted and air was removed.  Suspect patient's lungs are so frail that the plastic catheter punctured the lugn..          Condition: stable  Plan A follow up chest x-ray was ordered. Bed Rest for 5 hours. Tylenol 650 mg. for pain.  Attending Attestation: I was present and scrubbed for the entire procedure.  After getting air back decision was made to place a chest tube, please see chest tube note.  Alyson ReedyWesam G. Benny Deutschman, M.D. [facility]/Critical [facility] Medicine. Pager: [phone]. After hours pager: [phone].

## 2013-06-03 ENCOUNTER — Other Ambulatory Visit (HOSPITAL_COMMUNITY): Payer: Medicare Other

## 2013-06-03 LAB — PH, BODY FLUID: PH, FLUID: 8

## 2013-06-03 NOTE — Progress Notes (Signed)
Name: Brandon FuchsFredrick L Marcoe MRN: [id number] DOB: November 25, 1945    ADMISSION DATE:  05/18/2013 CONSULTATION DATE:  05/21/2103  REFERRING MD :  Carron CurieAli Hijazi  CHIEF COMPLAINT:  Short of breath  BRIEF PATIENT DESCRIPTION:  68 yr old male with hx of ETOH [facility]  He was transferred to medical facility with hypoxia and pneumonia likely from aspiration with dysphagia.  [facility]  PCCM consulted 05/20/2013 due to respiratory failure.  SIGNIFICANT EVENTS: [facility]  STUDIES:   LINES / TUBES: ETT 4/21 >> 4/28 Trach (df) 4/28>>>  CULTURES: Sputum 4/21 >>  BAl bronch 4/28>>>  ANTIBIOTICS: 4/27 ceftaz>>> 4/27 vanc>>>  SUBJECTIVE:  Weaning well, awake  VITAL SIGNS: Reviewed in bedside chart.  PHYSICAL EXAMINATION: General:  Ill appearing,  Cachectic, agitated at times Neuro: pre trach rasss -1 HEENT:  Temporal wasting, feeding tube in nares, dry oral mucosa Cardiovascular:  Regular, tachycardic Lungs: no distress. Decreased on left. Scattered rhonchi  Abdomen:  Soft, thin, decreased bowel sounds Musculoskeletal:  Decreased muscle bulk Skin:  No rash  CBC Recent Labs     06/01/13  0745  WBC  4.5  HGB  8.5*  HCT  26.1*  PLT  256    BMET Recent Labs     06/01/13  0745  NA  136*  K  4.4  CL  95*  CO2  32  BUN  11  CREATININE  0.38*  GLUCOSE  105*    Electrolytes Recent Labs     06/01/13  0745  CALCIUM  8.4    ABG No results found for this basename: PHART, PCO2ART, PO2ART,  in the last 72 hours Imaging Ir Gastrostomy Tube Mod Sed  06/02/2013   CLINICAL DATA:  Dysphagia, failure to thrive, poor nutritional status  EXAM: FLUOROSCOPIC 20 FRENCH PULL-THROUGH GASTROSTOMY  Date:  5/4/20155/04/2013 2:56 PM  Radiologist:  Judie PetitM. Ruel Favorsrevor Shick, MD  Guidance:  Fluoroscopic  FLUOROSCOPY TIME:  9 min 18 seconds  MEDICATIONS AND MEDICAL HISTORY: 1 g Ancefadministered within 1 hour of the procedure,4 mg Versed, 200  mcg fentanyl  ANESTHESIA/SEDATION: 27 min  CONTRAST:  15mL OMNIPAQUE IOHEXOL 300 MG/ML  SOLN  COMPLICATIONS: No immediate  PROCEDURE: Informed consent was obtained from the patient following explanation of the procedure, risks, benefits and alternatives. The patient understands, agrees and consents for the procedure. All questions were addressed. A time out was performed.  Maximal barrier sterile technique utilized including caps, mask, sterile gowns, sterile gloves, large sterile drape, hand hygiene, and betadine prep.  The left upper quadrant was sterilely prepped and draped. An oral gastric catheter was inserted into the stomach under fluoroscopy. The existing nasogastric feeding tube was removed. Air was injected into the stomach for insufflation and visualization under fluoroscopy. The air distended stomach was confirmed beneath the anterior abdominal wall in the frontal and lateral projections. Under sterile conditions and local anesthesia, a 17 gauge trocar needle was utilized to access the stomach percutaneously beneath the left subcostal margin. Needle position was confirmed within the stomach under biplane fluoroscopy. Contrast injection confirmed position also. A single T tack was deployed for gastropexy. Over an Amplatz guide wire, a 9-French sheath was inserted into the stomach. A snare device was utilized to capture the oral gastric catheter. The snare device was pulled retrograde from the stomach up the esophagus and out the oropharynx. The 20-French pull-through gastrostomy was connected to the snare device and pulled antegrade  through the oropharynx down the esophagus into the stomach and then through the percutaneous tract external to the patient. The gastrostomy was assembled externally. Contrast injection confirms position in the stomach. Images were obtained for documentation. The patient tolerated procedure well. No immediate complication.  IMPRESSION: Fluoroscopic insertion of a 20-French  "pull-through" gastrostomy.   Electronically Signed   By: Ruel Favorsrevor  Shick M.D.   On: 06/02/2013 15:02   Dg Chest Port 1 View  06/03/2013   CLINICAL DATA:  Respiratory failure  EXAM: PORTABLE CHEST - 1 VIEW  COMPARISON:  DG CHEST 1V PORT dated 06/02/2013; DG CHEST 1V PORT dated 06/01/2013; CT ANGIO CHEST dated 05/12/2013  FINDINGS: Small left apical pneumothorax measuring 8 mm from the apical chest wall with left chest tube in place.  Tracheostomy tube and left PICC line are unchanged. Stable cardiac silhouette. Bibasilar atelectasis not changed from prior.  IMPRESSION: 1. Very small left apical pneumothorax with chest tube in place. 2. Bibasilar dense atelectasis is unchanged.   Electronically Signed   By: Genevive BiStewart  Edmunds M.D.   On: 06/03/2013 08:15   Dg Chest Port 1 View  06/02/2013   CLINICAL DATA:  Status post thoracentesis in left chest tube placement.  EXAM: PORTABLE CHEST - 1 VIEW  COMPARISON:  Single view of the chest 06/01/2013.  FINDINGS: Left pleural effusion seen on the prior study has decreased in size with a new left chest tube in place. No pneumothorax is visualized. Small right pleural effusion is unchanged. There is basilar airspace disease bilaterally. Heart size normal. Tracheostomy tube and left PICC are noted. Feeding tube has been removed.  IMPRESSION: Decreased left pleural effusion with a chest tube in place. Negative for pneumothorax.  Small right pleural effusion.  Bibasilar airspace disease, worse on the left, likely due to atelectasis.   Electronically Signed   By: Drusilla Kannerhomas  Dalessio M.D.   On: 06/02/2013 13:13   Dg Abd Portable 1v  06/02/2013   CLINICAL DATA:  Evaluate oral contrast for gastrostomy tube placement.  EXAM: PORTABLE ABDOMEN - 1 VIEW  COMPARISON:  05/29/2013  FINDINGS: The feeding tube has been removed. There is barium throughout the colon, including the transverse colon. Persistent densities in the retrocardiac space and suspect left pleural fluid. There is no significant gas  distention in the bowel loops. Residual contrast or a calcification in medial left kidney region.  IMPRESSION: The colon is well opacified with contrast.  Persistent densities at the left lung base.   Electronically Signed   By: Richarda OverlieAdam  Henn M.D.   On: 06/02/2013 07:51  bilateral airspace disease. Improved aeration. Likely mix of pna/atx. May be element of effusion on left.   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  Left sided atx/effusion.    P: - Cont weaning efforts - Still needs support noctrunal - Continue abx given findings on pleural fluid evaluation. - Avoid sedation. - Diagnostic/therapeutic thoracentesis revealed exudative fluid with high WBC and high PMN count consistent with a parapneumonic effusion specially with high PMN levels but not high enough to suggest empyema, recommend keeping CT in there until drainage subsides. - It might be a consideration to begin EOL in this case as I do not foresee any reasonable chances at functional recovery given chronic issues noted above.  Alyson ReedyWesam G. Bessye Stith, M.D. [facility]/Critical [facility] Medicine.  Pager: [phone]. After hours pager: [phone].

## 2013-06-04 LAB — PROTEIN ELECTROPHORESIS, SERUM
Albumin ELP: 42.3 % — ABNORMAL LOW (ref 55.8–66.1)
Alpha-1-Globulin: 11.5 % — ABNORMAL HIGH (ref 2.9–4.9)
Alpha-2-Globulin: 17.8 % — ABNORMAL HIGH (ref 7.1–11.8)
Beta 2: 6.2 % (ref 3.2–6.5)
Beta Globulin: 5.7 % (ref 4.7–7.2)
GAMMA GLOBULIN: 16.5 % (ref 11.1–18.8)
M-Spike, %: NOT DETECTED g/dL
TOTAL PROTEIN ELP: 5.2 g/dL — AB (ref 6.0–8.3)

## 2013-06-04 NOTE — Progress Notes (Signed)
   Name: Brandon FuchsFredrick L Skinner MRN: [id number] DOB: 12/23/1945    ADMISSION DATE:  05/18/2013 CONSULTATION DATE:  05/21/2103  REFERRING MD :  Carron CurieAli Hijazi  CHIEF COMPLAINT:  Short of breath  BRIEF PATIENT DESCRIPTION:  68 yr old male with [facility]x of ETOH [facility]  He was transferred to medical facility with hypoxia and pneumonia likely from aspiration with dysphagia.  [facility]  PCCM consulted 05/20/2013 due to respiratory failure.  SIGNIFICANT EVENTS: [facility]  STUDIES:   LINES / TUBES: ETT 4/21 >> 4/28 Trach (df) 4/28>>>  CULTURES: Sputum 4/21 >>  BAl bronch 4/28>>>  ANTIBIOTICS: 4/27 ceftaz>>> 4/27 vanc>>>  SUBJECTIVE:  Weaning well, awake  VITAL SIGNS: Reviewed in bedside chart.  PHYSICAL EXAMINATION: General:  Ill appearing,  Cachectic, agitated at times Neuro: pre trach rasss -1 HEENT:  Temporal wasting, feeding tube in nares, dry oral mucosa Cardiovascular:  Regular, tachycardic Lungs: no distress. Decreased on left. Scattered rhonchi no air leak on CT  Abdomen:  Soft, thin, decreased bowel sounds Musculoskeletal:  Decreased muscle bulk Skin:  No rash  CBC No results found for this basename: WBC, HGB, HCT, PLT,  in the last 72 hours  BMET No results found for this basename: NA, K, CL, CO2, BUN, CREATININE, GLUCOSE,  in the last 72 hours  Electrolytes No results found for this basename: CALCIUM, MG, PHOS,  in the last 72 hours  ABG No results found for this basename: PHART, PCO2ART, PO2ART,  in the last 72 hours Imaging Dg Chest Port 1 View  06/03/2013   CLINICAL DATA:  Respiratory failure  EXAM: PORTABLE CHEST - 1 VIEW  COMPARISON:  DG CHEST 1V PORT dated 06/02/2013; DG CHEST 1V PORT dated 06/01/2013; CT ANGIO CHEST dated 05/12/2013  FINDINGS: Small left apical pneumothorax measuring 8 mm from the apical chest wall with left chest tube in place.  Tracheostomy tube and left PICC line are unchanged.  Stable cardiac silhouette. Bibasilar atelectasis not changed from prior.  IMPRESSION: 1. Very small left apical pneumothorax with chest tube in place. 2. Bibasilar dense atelectasis is unchanged.   Electronically Signed   By: Genevive BiStewart  Edmunds M.D.   On: 06/03/2013 08:15  bilateral airspace disease. Improved aeration. Left effusion resolved. Bibasilar atx persists. 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 No film today.   P: - Cont weaning efforts - Continue abx given findings on pleural fluid evaluation. - Avoid sedation. -repeat cxr in am, if no ptx and still no airleak will change to water seal  -consider EOL discussions. Prognosis poor given degree of deconditioning   Patient seen and examined, agree with above note.  I dictated the care and orders written for this patient under my direction.  Alyson ReedyWesam G Viktoria Gruetzmacher, MD [phone]

## 2013-06-05 ENCOUNTER — Other Ambulatory Visit (HOSPITAL_COMMUNITY): Payer: Medicare Other

## 2013-06-05 LAB — BODY FLUID CULTURE: Culture: NO GROWTH

## 2013-06-05 LAB — OTHER BODY FLUID CHEMISTRY

## 2013-06-05 LAB — CULTURE, BLOOD (ROUTINE X 2)
Culture: NO GROWTH
Culture: NO GROWTH

## 2013-06-05 NOTE — Progress Notes (Signed)
Name: Brandon Skinner MRN: [id number] DOB: 12-17-1945    ADMISSION DATE:  05/18/2013 CONSULTATION DATE:  05/21/2103  REFERRING MD :  Carron CurieAli Hijazi  CHIEF COMPLAINT:  Short of breath  BRIEF PATIENT DESCRIPTION:  68 yr old male with hx of ETOH [facility]  He was transferred to medical facility with hypoxia and pneumonia likely from aspiration with dysphagia.  [facility]  PCCM consulted 05/20/2013 due to respiratory failure.  SIGNIFICANT EVENTS: [facility]  STUDIES:   LINES / TUBES: ETT 4/21 >> 4/28 Trach (df) 4/28>>> CT left 5/4>>>  CULTURES: Sputum 4/21 >> NPF BAl bronch 4/28>>>NPF  ANTIBIOTICS: 4/27 ceftaz>>> 4/27 vanc>>>  SUBJECTIVE:  Very weak, c/o pain  VITAL SIGNS: Reviewed in bedside chart.  PHYSICAL EXAMINATION: General:  Ill appearing,  Cachectic, agitated at times Neuro:  rass -1, follows commands. C/o pain, think primarily at CT site  HEENT:  Temporal wasting, trach w/ thick secretions  Cardiovascular:  Regular, tachycardic Lungs: no distress on PSV of 14 cmH20. . Scattered rhonchi no air leak on CT  Abdomen:  Soft, thin, decreased bowel sounds Musculoskeletal:  Decreased muscle bulk Skin:  No rash  CBC No results found for this basename: WBC, HGB, HCT, PLT,  in the last 72 hours  BMET No results found for this basename: NA, K, CL, CO2, BUN, CREATININE, GLUCOSE,  in the last 72 hours  Electrolytes No results found for this basename: CALCIUM, MG, PHOS,  in the last 72 hours  ABG No results found for this basename: PHART, PCO2ART, PO2ART,  in the last 72 hours Imaging Dg Chest Port 1 View  06/05/2013   CLINICAL DATA:  Left-sided pneumothorax, chest tube  EXAM: PORTABLE CHEST - 1 VIEW  COMPARISON:  Prior chest x-ray 06/03/2013  FINDINGS: Stable position of left thoracostomy tube. Left upper extremity approach PICC also in unchanged position. The tip of the catheter is in the distal SVC.  Tracheostomy tube is stable with tip midline and at the level of the clavicles. No definite residual left pneumothorax. There is a small layering right pleural effusion and associated right basilar opacity. The lungs are hyperexpanded with a background changes of COPD. Stable cardiac and mediastinal contours. No acute osseous abnormality.  IMPRESSION: 1. No residual left pneumothorax with chest tube in place. 2. Persistent layering right pleural effusion and right basilar opacity which may reflect atelectasis and/ infiltrate. 3. Support apparatus in stable and satisfactory position.   Electronically Signed   By: Malachy MoanHeath  McCullough M.D.   On: 06/05/2013 08:05  right sided airspace disease and prob element of effusion. Left PTX resolved. 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 He c/o severe pain when ever he is awake, then has periods of apnea when he gets narcotics for this pain. Hope that we can get his CT out soon.. If this is the reason for his pain then getting it removed should help.   CXR on 5/7  w/ resolution of fluid and PTX. So ready to change to water seal He has a right effusion as well... Given his issue on the left think we will hold off  on thora. 5/7  P: - Cont weaning efforts - Continue abx given findings on pleural fluid evaluation, complete 10d prob adequate  - Avoid sedation. -convert to water seal 5/7, repeat cxr in am 5/8, if no ptx --> will consider clamping trials vs d/c -diurese as able -hold off on thora for right side.  -consider EOL discussions. Prognosis poor given degree of deconditioning    Simonne MartinetPeter E Babcock, NP  Patient seen and examined, agree with above note.  I dictated the care and orders written for this patient under my direction.  Alyson ReedyWesam G Savaughn Karwowski, MD [phone]

## 2013-06-06 ENCOUNTER — Other Ambulatory Visit (HOSPITAL_COMMUNITY): Payer: Medicare Other

## 2013-06-07 LAB — BASIC METABOLIC PANEL
BUN: 17 mg/dL (ref 6–23)
CHLORIDE: 92 meq/L — AB (ref 96–112)
CO2: 35 mEq/L — ABNORMAL HIGH (ref 19–32)
Calcium: 8.5 mg/dL (ref 8.4–10.5)
Creatinine, Ser: 0.35 mg/dL — ABNORMAL LOW (ref 0.50–1.35)
Glucose, Bld: 131 mg/dL — ABNORMAL HIGH (ref 70–99)
Potassium: 4.5 mEq/L (ref 3.7–5.3)
Sodium: 133 mEq/L — ABNORMAL LOW (ref 137–147)

## 2013-06-08 ENCOUNTER — Other Ambulatory Visit (HOSPITAL_COMMUNITY): Payer: Medicare Other

## 2013-06-08 LAB — BLOOD GAS, ARTERIAL
Acid-Base Excess: 10.4 mmol/L — ABNORMAL HIGH (ref 0.0–2.0)
Bicarbonate: 35.5 mEq/L — ABNORMAL HIGH (ref 20.0–24.0)
FIO2: 0.4 %
O2 Saturation: 95.9 %
PCO2 ART: 57.8 mmHg — AB (ref 35.0–45.0)
PEEP: 5 cmH2O
PH ART: 7.405 (ref 7.350–7.450)
Patient temperature: 98.4
Pressure support: 14 cmH2O
TCO2: 37.3 mmol/L (ref 0–100)
pO2, Arterial: 83.8 mmHg (ref 80.0–100.0)

## 2013-06-08 LAB — PRO B NATRIURETIC PEPTIDE: Pro B Natriuretic peptide (BNP): 53.7 pg/mL (ref 0–125)

## 2013-06-09 ENCOUNTER — Other Ambulatory Visit (HOSPITAL_COMMUNITY): Payer: Medicare Other

## 2013-06-09 NOTE — Progress Notes (Signed)
Name: Brandon Skinner MRN: [id number] DOB: 1945/10/19    ADMISSION DATE:  05/18/2013 CONSULTATION DATE:  05/21/2103  REFERRING MD :  Carron CurieAli Hijazi  CHIEF COMPLAINT:  Short of breath  BRIEF PATIENT DESCRIPTION:  68 yr old male with hx of ETOH was in [facility].  He was transferred to medical facility with hypoxia and pneumonia likely from aspiration with dysphagia.  [facility]  PCCM consulted 05/20/2013 due to respiratory failure.  SIGNIFICANT EVENTS: [facility]  STUDIES:   LINES / TUBES: ETT 4/21 >> 4/28 Trach (df) 4/28>>> CT left 5/4>>>  CULTURES: Sputum 4/21 >> NPF BAl bronch 4/28>>>NPF  ANTIBIOTICS: 4/27 ceftaz>>> 4/27 vanc>>>  SUBJECTIVE:  Very weak, c/o pain, still   VITAL SIGNS: Reviewed in bedside chart.  PHYSICAL EXAMINATION: General:  Ill appearing,  Cachectic, agitated at times Neuro:  rass -1, follows commands. C/o pain, think primarily at CT site  HEENT:  Temporal wasting, trach unremarkable   Cardiovascular:  Regular, tachycardic Lungs:  Scattered rhonchi no air leak on CT   Abdomen:  Soft, thin, decreased bowel sounds Musculoskeletal:  Decreased muscle bulk Skin:  No rash  CBC No results found for this basename: WBC, HGB, HCT, PLT,  in the last 72 hours  BMET Recent Labs     06/07/13  0500  NA  133*  K  4.5  CL  92*  CO2  35*  BUN  17  CREATININE  0.35*  GLUCOSE  131*    Electrolytes Recent Labs     06/07/13  0500  CALCIUM  8.5    ABG Recent Labs     06/08/13  1555  PHART  7.405  PCO2ART  57.8*  PO2ART  83.8   Imaging Dg Chest Port 1 View  06/08/2013   CLINICAL DATA:  chf  EXAM: PORTABLE CHEST - 1 VIEW  COMPARISON:  DG CHEST 1V PORT dated 06/06/2013  FINDINGS: Support lines and tubes are stable. Persistent areas of increased density within the lung bases. No pneumothorax. No new focal regions of consolidation. Decreased conspicuity of the prominent interstitial and  vascular structures.  IMPRESSION: Improved pulmonary edema.  Stable bibasilar densities.  Support apparatus  stable   Electronically Signed   By: Salome HolmesHector  Cooper M.D.   On: 06/08/2013 11:09  Right sided airspace disease and prob element of effusion. Left PTX resolved. 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  Right effusion still present but decreased. No PTX noted.  Weaning efforts: likely failing due to pain r/t CT   P: - Cont weaning efforts, failed for me PS 5, escalate to 12 today -goal to liberrate as able off pos pressure with PTX - Continue abx given findings on pleural fluid evaluation, complete 10d prob adequate  - Avoid sedation. -remove CT and recheck PCXR in 4 hours (water seal, no leak, no ptx, output way less 50 cc in 24 hrs) -diurese as able -hold off on thora for right side.  -consider EOL discussions. Prognosis poor given degree of deconditioning   Simonne MartinetPeter E Babcock, NP  I have fully examined this patient and agree with above findings.    And edited i nfull  Mcarthur Rossettianiel J. Tyson AliasFeinstein, MD, FACP Pgr: [phone] [facility]

## 2013-06-09 NOTE — Progress Notes (Signed)
[facility]                                                                                              Progress note     Patient Demographics  Brandon Skinner, is a 68 y.o. male  WUJ:[id number]  NFA:[id number]  DOB - Mar 22, 1945  Admit date - 05/18/2013  Admitting Physician Carron CurieAli Tykesha Konicki, MD  Outpatient Primary MD for the patient is PROVIDER NOT IN SYSTEM  LOS - 22   CC   Resp Failure   Dysphagia     Aspiration pneumonia    Pneumothorax      Subjective:   Brandon Skinner has no new complaints  Objective:   Vital signs  Temperature 97.9 Heart rate  85 Respiratory rate  15 Blood pressure  98/71 Pulse ox  97%     Exam Alert awake , confused [place].AT,PERRAL NGT In Place Supple Neck,No JVD, No cervical lymphadenopathy appriciated. ET tube in place Symmetrical Chest wall movement, decreased breath sounds bilaterally especially at the bases RRR,No Gallops,Rubs or new Murmurs, No Parasternal Heave +ve B.Sounds, Abd Soft, Periumbilical Tenderness, No organomegaly appriciated, No rebound - guarding or rigidity. No Cyanosis, Clubbing or edema, No new Rash or bruise    I&Os  2545/1250 Take yes   Data Review   CBC No results found for this basename: WBC, HGB, HCT, PLT, MCV, MCH, MCHC, RDW, NEUTRABS, LYMPHSABS, MONOABS, EOSABS, BASOSABS, BANDABS, BANDSABD,  in the last 168 hours  Chemistries   Recent Labs Lab 06/07/13 0500  NA 133*  K 4.5  CL 92*  CO2 35*  GLUCOSE 131*  BUN 17  CREATININE 0.35*  CALCIUM 8.5        Assessment & Plan   Resp Failure, status post tracheostomy tube placement today without problems continue with PSV trials B/L Asp Pna/HCAPS ; C/W IV abxs Dysphagia continue with NG tube feeding Hypernatremia resolved Deconditioning PT OT*. Alcoholism continue with thiamine/folate  PCM continue with tube feeding and NG tube Bilateral pleural effusions  status post thoracentesis Left Pneumothorax status post chest tube placement minimal drainage  Plan   Remove chest tube Check chest x-ray in 4 hours Check labs and chest x-ray in am   DVT Prophylaxis Heparin CODE STATUS: Full   Carron CurieAli Maudene Stotler M.D on 06/09/2013 at 1:37 PM

## 2013-06-10 ENCOUNTER — Other Ambulatory Visit (HOSPITAL_COMMUNITY): Payer: Medicare Other

## 2013-06-10 LAB — BASIC METABOLIC PANEL
BUN: 13 mg/dL (ref 6–23)
CO2: 32 mEq/L (ref 19–32)
Calcium: 8.7 mg/dL (ref 8.4–10.5)
Chloride: 93 mEq/L — ABNORMAL LOW (ref 96–112)
Creatinine, Ser: 0.29 mg/dL — ABNORMAL LOW (ref 0.50–1.35)
GFR calc non Af Amer: >90 mL/min (ref >90)
Glucose, Bld: 136 mg/dL — ABNORMAL HIGH (ref 70–99)
POTASSIUM: 4 meq/L (ref 3.7–5.3)
Sodium: 135 mEq/L — ABNORMAL LOW (ref 137–147)

## 2013-06-10 LAB — CBC
HEMATOCRIT: 30.9 % — AB (ref 39.0–52.0)
Hemoglobin: 9.9 g/dL — ABNORMAL LOW (ref 13.0–17.0)
MCH: 29.9 pg (ref 26.0–34.0)
MCHC: 32 g/dL (ref 30.0–36.0)
MCV: 93.4 fL (ref 78.0–100.0)
PLATELETS: 465 10*3/uL — AB (ref 150–400)
RBC: 3.31 MIL/uL — ABNORMAL LOW (ref 4.22–5.81)
RDW: 14.3 % (ref 11.5–15.5)
WBC: 14.1 10*3/uL — AB (ref 4.0–10.5)

## 2013-06-10 NOTE — Progress Notes (Signed)
[facility]                                                                                              Progress note     Patient Demographics  Brandon Skinner, is a 68 y.o. male  ZOX:[id number]  WJX:[id number]  DOB - May 28, 1945  Admit date - 05/18/2013  Admitting Physician Carron CurieAli Kemon Devincenzi, MD  Outpatient Primary MD for the patient is PROVIDER NOT IN SYSTEM  LOS - 23   CC   Resp Failure   Dysphagia     Aspiration pneumonia    Pneumothorax      Subjective:   Brandon CoryFredrick Heathman confused today and cannot give any history  Objective:   Vital signs  Temperature 98.7 Heart rate  95 Respiratory rate  20 Blood pressure  133/83 Pulse ox  98%     Exam Alert awake , confused .AT,PERRAL NGT In Place Supple Neck,No JVD, No cervical lymphadenopathy appriciated. ET tube in place Symmetrical Chest wall movement, decreased breath sounds bilaterally especially at the bases RRR,No Gallops,Rubs or new Murmurs, No Parasternal Heave +ve B.Sounds, Abd Soft, Periumbilical Tenderness, No organomegaly appriciated, No rebound - guarding or rigidity. No Cyanosis, Clubbing or edema, No new Rash or bruise    I&Os  2700/2200 Trach yes   Data Review   CBC  Recent Labs Lab 06/10/13 0615  WBC 14.1*  HGB 9.9*  HCT 30.9*  PLT 465*  MCV 93.4  MCH 29.9  MCHC 32.0  RDW 14.3    Chemistries   Recent Labs Lab 06/07/13 0500 06/10/13 0615  NA 133* 135*  K 4.5 4.0  CL 92* 93*  CO2 35* 32  GLUCOSE 131* 136*  BUN 17 13  CREATININE 0.35* 0.29*  CALCIUM 8.5 8.7        Assessment & Plan   Resp Failure, status post tracheostomy tube placement today without problems continue with PSV trials B/L Asp Pna/HCAPS ; C/W IV abxs Dysphagia continue with NG tube feeding Hypernatremia resolved Deconditioning PT OT*. Alcoholism continue with thiamine/folate  PCM continue with tube feeding and NG  tube Bilateral pleural effusions status post thoracentesis Left Pneumothorax status post chest tube placement minimal drainage/chest tube removed today Leukocytosis, monitor  Plan  Follow chest x-ray Check labs in a.m.   DVT Prophylaxis Heparin CODE STATUS: Full   Carron CurieAli Hermina Barnard M.D on 06/10/2013 at 11:38 AM

## 2013-06-10 NOTE — Progress Notes (Signed)
Name: Brandon FuchsFredrick L Skinner MRN: [id number] DOB: [birth year]    ADMISSION DATE:  05/18/2013 CONSULTATION DATE:  05/21/2103  REFERRING MD :  Carron CurieAli Hijazi  CHIEF COMPLAINT:  Short of breath  BRIEF PATIENT DESCRIPTION:  68 yr old male with hx of ETOH was in [facility].  He was transferred to medical facility with hypoxia and pneumonia likely from aspiration with dysphagia.  [facility]  PCCM consulted 05/20/2013 due to respiratory failure.  SIGNIFICANT EVENTS: [facility]  STUDIES:   LINES / TUBES: ETT 4/21 >> 4/28 Trach (df) 4/28>>> CT left 5/4>>>  CULTURES: Sputum 4/21 >> NPF BAl bronch 4/28>>>NPF  ANTIBIOTICS: 4/27 ceftaz>>> 4/27 vanc>>>  SUBJECTIVE:  Very weak, c/o pain, still, weaning but on high PSV   VITAL SIGNS: Reviewed in bedside chart.  PHYSICAL EXAMINATION: General:  Ill appearing,  Cachectic, c/o pain  Neuro:  rass -1, follows commands. C/o pain, think primarily at CT site  HEENT:  Temporal wasting, trach unremarkable   Cardiovascular:  Regular, tachycardic Lungs:  Scattered rhonchi diffuse  Abdomen:  Soft, thin, decreased bowel sounds Musculoskeletal:  Decreased muscle bulk Skin:  No rash  CBC Recent Labs     06/10/13  0615  WBC  14.1*  HGB  9.9*  HCT  30.9*  PLT  465*    BMET Recent Labs     06/10/13  0615  NA  135*  K  4.0  CL  93*  CO2  32  BUN  13  CREATININE  0.29*  GLUCOSE  136*    Electrolytes Recent Labs     06/10/13  0615  CALCIUM  8.7    ABG Recent Labs     06/08/13  1555  PHART  7.405  PCO2ART  57.8*  PO2ART  83.8   Imaging Ct Head Wo Contrast  06/09/2013   CLINICAL DATA:  sinusitis  EXAM: CT HEAD WITHOUT CONTRAST  TECHNIQUE: Contiguous axial images were obtained from the base of the skull through the vertex without intravenous contrast.  COMPARISON:  Prior CT from 05/25/2013  FINDINGS: Atrophy with mild chronic microvascular ischemic changes is stable as  compared prior study.  There is no acute intracranial hemorrhage or infarct. No mass lesion or midline shift. Gray-white matter differentiation is well maintained. Ventricles are normal in size without evidence of hydrocephalus. No extra-axial fluid collection.  The calvarium is intact.  Orbital soft tissues are within normal limits.  The paranasal sinuses are well pneumatized and free of fluid. Trace opacity noted within the a left mastoid air cells. The right mastoid air cells are clear.  Scalp soft tissues are unremarkable.  IMPRESSION: 1. No acute intracranial process. 2. Stable generalized cerebral atrophy with mild chronic microvascular ischemic changes. 3. Clear sinuses.   Electronically Signed   By: Rise MuBenjamin  McClintock M.D.   On: 06/09/2013 12:18  Right sided airspace disease and prob element of effusion. Left PTX resolved. 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  Right effusion still present but decreased. No PTX noted.  Weaning efforts: likely failing due to  pain r/t CT. Now weaning but requiring almost full support equivalent on PSV.  CT removed am 5/12  P: - Cont weaning efforts, now on PSV of 18, such poor volumes have me concerned for eventual liberation -goal to liberrate as able off pos pressure  - Continue abx given findings on pleural fluid evaluation, complete 10d prob adequate  - Avoid sedation. -f/u CXR this afternoon after CT out -diurese as able -hold off on thora for right side.  -consider EOL discussions. Prognosis poor given degree of  deconditioning   Simonne MartinetPeter E Babcock, NP   I have fully examined this patient and agree with above findings.    And edite dinfull  Mcarthur Rossettianiel J. Tyson AliasFeinstein, MD, FACP Pgr: [phone] [facility]

## 2013-06-11 ENCOUNTER — Encounter: Payer: Self-pay | Admitting: Radiology

## 2013-06-11 ENCOUNTER — Other Ambulatory Visit (HOSPITAL_COMMUNITY): Payer: Medicare Other

## 2013-06-11 LAB — URINALYSIS, ROUTINE W REFLEX MICROSCOPIC
BILIRUBIN URINE: NEGATIVE
GLUCOSE, UA: NEGATIVE mg/dL
Ketones, ur: NEGATIVE mg/dL
Leukocytes, UA: NEGATIVE
NITRITE: NEGATIVE
PH: 7.5 (ref 5.0–8.0)
Protein, ur: 30 mg/dL — AB
SPECIFIC GRAVITY, URINE: 1.005 (ref 1.005–1.030)
Urobilinogen, UA: 1 mg/dL (ref 0.0–1.0)

## 2013-06-11 LAB — URINE MICROSCOPIC-ADD ON

## 2013-06-11 LAB — BASIC METABOLIC PANEL
BUN: 16 mg/dL (ref 6–23)
CHLORIDE: 91 meq/L — AB (ref 96–112)
CO2: 33 mEq/L — ABNORMAL HIGH (ref 19–32)
Calcium: 8.7 mg/dL (ref 8.4–10.5)
Creatinine, Ser: 0.34 mg/dL — ABNORMAL LOW (ref 0.50–1.35)
GFR calc Af Amer: >90 mL/min (ref >90)
GFR calc non Af Amer: >90 mL/min (ref >90)
GLUCOSE: 148 mg/dL — AB (ref 70–99)
POTASSIUM: 4.4 meq/L (ref 3.7–5.3)
SODIUM: 133 meq/L — AB (ref 137–147)

## 2013-06-11 LAB — CBC
HCT: 31.6 % — ABNORMAL LOW (ref 39.0–52.0)
HEMOGLOBIN: 10.1 g/dL — AB (ref 13.0–17.0)
MCH: 29.9 pg (ref 26.0–34.0)
MCHC: 32 g/dL (ref 30.0–36.0)
MCV: 93.5 fL (ref 78.0–100.0)
Platelets: 446 10*3/uL — ABNORMAL HIGH (ref 150–400)
RBC: 3.38 MIL/uL — ABNORMAL LOW (ref 4.22–5.81)
RDW: 14.7 % (ref 11.5–15.5)
WBC: 13.4 10*3/uL — AB (ref 4.0–10.5)

## 2013-06-11 LAB — PROCALCITONIN: Procalcitonin: 0.52 ng/mL

## 2013-06-11 MED ORDER — IOHEXOL 300 MG/ML  SOLN
100.0000 mL | Freq: Once | INTRAMUSCULAR | Status: AC | PRN
  Administered 2013-06-11: 100 mL via INTRAVENOUS

## 2013-06-11 NOTE — Progress Notes (Signed)
Name: Brandon Skinner MRN: [id number] DOB: 03/10/1945    ADMISSION DATE:  05/18/2013 CONSULTATION DATE:  05/21/2103  REFERRING MD :  Carron CurieAli Hijazi  CHIEF COMPLAINT:  Short of breath  BRIEF PATIENT DESCRIPTION:  68 yr old male with hx of ETOH [facility]  He was transferred to medical facility with hypoxia and pneumonia likely from aspiration with dysphagia.  [facility]  PCCM consulted 05/20/2013 due to respiratory failure.  SIGNIFICANT EVENTS: [facility]  STUDIES:   LINES / TUBES: ETT 4/21 >> 4/28 Trach (df) 4/28>>> CT left 5/4>>>  CULTURES: Sputum 4/21 >> NPF BAl bronch 4/28>>>NPF  ANTIBIOTICS: 4/27 ceftaz>>> 4/27 vanc>>>  SUBJECTIVE:  Very weak, c/o pain, still, weaning but on high PSV   VITAL SIGNS: Reviewed in bedside chart.  PHYSICAL EXAMINATION: General:  Ill appearing,  Cachectic, struggles w/ PSV less than 12 Neuro:  rass -1, follows commands. Profoundly weak  HEENT:  Temporal wasting, trach unremarkable   Cardiovascular:  Regular, tachycardic  Lungs:  Scattered rhonchi diffuse  Abdomen:  Soft, thin, decreased bowel sounds  Musculoskeletal:  Decreased muscle bulk  Skin:  No rash   CBC Recent Labs     06/10/13  0615  06/11/13  0550  WBC  14.1*  13.4*  HGB  9.9*  10.1*  HCT  30.9*  31.6*  PLT  465*  446*    BMET Recent Labs     06/10/13  0615  06/11/13  0550  NA  135*  133*  K  4.0  4.4  CL  93*  91*  CO2  32  33*  BUN  13  16  CREATININE  0.29*  0.34*  GLUCOSE  136*  148*    Electrolytes Recent Labs     06/10/13  0615  06/11/13  0550  CALCIUM  8.7  8.7    ABG Recent Labs     06/08/13  1555  PHART  7.405  PCO2ART  57.8*  PO2ART  83.8   Imaging Ct Head Wo Contrast  06/09/2013   CLINICAL DATA:  sinusitis  EXAM: CT HEAD WITHOUT CONTRAST  TECHNIQUE: Contiguous axial images were obtained from the base of the skull through the vertex without intravenous contrast.   COMPARISON:  Prior CT from 05/25/2013  FINDINGS: Atrophy with mild chronic microvascular ischemic changes is stable as compared prior study.  There is no acute intracranial hemorrhage or infarct. No mass lesion or midline shift. Gray-white matter differentiation is well maintained. Ventricles are normal in size without evidence of hydrocephalus. No extra-axial fluid collection.  The calvarium is intact.  Orbital soft tissues are within normal limits.  The paranasal sinuses are well pneumatized and free of fluid. Trace opacity noted within the a left mastoid air cells. The right mastoid air cells are clear.  Scalp soft tissues are unremarkable.  IMPRESSION: 1. No acute intracranial process. 2. Stable generalized cerebral atrophy with mild chronic microvascular ischemic changes. 3. Clear sinuses.   Electronically Signed   By: Rise MuBenjamin  McClintock M.D.   On: 06/09/2013 12:18   Dg Chest Port 1 View  06/10/2013   CLINICAL DATA:  Post chest tube removal, possible pneumothorax  EXAM: PORTABLE CHEST - 1 VIEW  COMPARISON:  06/08/2013  FINDINGS: Left chest tube has been removed. No pneumothorax. Stable a left PICC line position. Stable thoracostomy tube position. No pulmonary edema. There is small bilateral pleural effusion. Hazy bilateral lower lobe  atelectasis or infiltrate.  IMPRESSION: Left chest tube has been removed. No pneumothorax. Small bilateral pleural effusion. Hazy bilateral lower lobe atelectasis or infiltrate.   Electronically Signed   By: Natasha MeadLiviu  Pop M.D.   On: 06/10/2013 13:53  Right sided airspace disease and prob element of effusion. Left PTX resolved. 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 Weaning efforts: fails attempts at PSV < 12. Requires high PSV to ventilate and have very little reserve.    P: - Cont weaning efforts, as tolerated.ps to 20  - Avoid sedation. -diurese: will add lasix Q 12. Would like to see minimum neg 1 liter or more balance  -hold off on thora for right side -assess echo   -consider EOL discussions. Prognosis poor given degree of deconditioning   Simonne MartinetPeter E Babcock, NP   I have fully examined this patient and agree with above findings.      Mcarthur Rossettianiel J. Tyson AliasFeinstein, MD, FACP Pgr: [phone] [facility]

## 2013-06-11 NOTE — Progress Notes (Signed)
[facility]                                                                                              Progress note     Patient Demographics  Meyer CoryFredrick Sweeden, is a 68 y.o. male  AVW:[id number]  WGN:[id number]  DOB - 27-Oct-1945  Admit date - 05/18/2013  Admitting Physician Carron CurieAli Jary Louvier, MD  Outpatient Primary MD for the patient is PROVIDER NOT IN SYSTEM  LOS - 24   CC   Resp Failure   Dysphagia     Aspiration pneumonia    Pneumothorax      Subjective:   Meyer CoryFredrick Deblois confused today and cannot give any history  Objective:   Vital signs  Temperature 99.2 Heart rate  121 Respiratory rate  20 Blood pressure  130/67 Pulse ox  93%     Exam Alert awake , confused [place].AT,PERRAL NGT In Place Supple Neck,No JVD, No cervical lymphadenopathy appriciated. ET tube in place Symmetrical Chest wall movement, decreased breath sounds bilaterally especially at the bases RRR,No Gallops,Rubs or new Murmurs, No Parasternal Heave +ve B.Sounds, Abd Soft, Periumbilical Tenderness, No organomegaly appriciated, No rebound - guarding or rigidity. No Cyanosis, Clubbing or edema, No new Rash or bruise    I&Os  2040/1075 Trach yes   Data Review   CBC  Recent Labs Lab 06/10/13 0615 06/11/13 0550  WBC 14.1* 13.4*  HGB 9.9* 10.1*  HCT 30.9* 31.6*  PLT 465* 446*  MCV 93.4 93.5  MCH 29.9 29.9  MCHC 32.0 32.0  RDW 14.3 14.7    Chemistries   Recent Labs Lab 06/07/13 0500 06/10/13 0615 06/11/13 0550  NA 133* 135* 133*  K 4.5 4.0 4.4  CL 92* 93* 91*  CO2 35* 32 33*  GLUCOSE 131* 136* 148*  BUN 17 13 16   CREATININE 0.35* 0.29* 0.34*  CALCIUM 8.5 8.7 8.7        Assessment & Plan   Resp Failure, status post tracheostomy tube placement today without problems continue with PSV trials B/L Asp Pna/HCAPS ; off IV antibiotics Dysphagia continue with PEG tube feeding Hypernatremia  resolved Deconditioning PT OT*. Alcoholism continue with thiamine/folate  PCM continue with tube feeding and NG tube Bilateral pleural effusions status post thoracentesis Left Pneumothorax status post chest tube removed Leukocytosis, improving  Plan  Check CT of the chest, abdomen, and pelvis to rule out occult malignancy Check BMP in a.m.   DVT Prophylaxis Heparin CODE STATUS: Full   Carron CurieAli Elizabeth Paulsen M.D on 06/11/2013 at 12:01 PM

## 2013-06-12 DIAGNOSIS — I517 Cardiomegaly: Secondary | ICD-10-CM

## 2013-06-12 LAB — BASIC METABOLIC PANEL
BUN: 17 mg/dL (ref 6–23)
CO2: 32 meq/L (ref 19–32)
CREATININE: 0.41 mg/dL — AB (ref 0.50–1.35)
Calcium: 8.8 mg/dL (ref 8.4–10.5)
Chloride: 93 mEq/L — ABNORMAL LOW (ref 96–112)
GFR calc Af Amer: >90 mL/min (ref >90)
GFR calc non Af Amer: >90 mL/min (ref >90)
Glucose, Bld: 115 mg/dL — ABNORMAL HIGH (ref 70–99)
Potassium: 4.6 mEq/L (ref 3.7–5.3)
Sodium: 135 mEq/L — ABNORMAL LOW (ref 137–147)

## 2013-06-12 LAB — URINE CULTURE
COLONY COUNT: NO GROWTH
Culture: NO GROWTH
Special Requests: NORMAL

## 2013-06-12 LAB — AFP TUMOR MARKER: AFP-Tumor Marker: <1.3 ng/mL (ref 0.0–8.0)

## 2013-06-12 LAB — CEA: CEA: 3.7 ng/mL (ref 0.0–5.0)

## 2013-06-12 NOTE — Progress Notes (Signed)
[facility]                                                                                              Progress note     Patient Demographics  Brandon Skinner, is a 68 y.o. male  UJW:[id number]  FAO:[id number]  DOB - May 15, 1945  Admit date - 05/18/2013  Admitting Physician Carron CurieAli Spenser Cong, MD  Outpatient Primary MD for the patient is PROVIDER NOT IN SYSTEM  LOS - 25   CC   Resp Failure   Dysphagia     Aspiration pneumonia    Pneumothorax      Subjective:   Brandon Skinner confused today and cannot give any history  Objective:   Vital signs  Temperature 97.5 Heart rate  95 Respiratory rate  16 Blood pressure  98/75 Pulse ox  100 %     Exam Alert awake , confused [place].AT,PERRAL NGT In Place Supple Neck,No JVD, No cervical lymphadenopathy appriciated. ET tube in place Symmetrical Chest wall movement, decreased breath sounds bilaterally especially at the bases RRR,No Gallops,Rubs or new Murmurs, No Parasternal Heave +ve B.Sounds, Abd Soft, Periumbilical Tenderness, No organomegaly appriciated, No rebound - guarding or rigidity. No Cyanosis, Clubbing or edema, No new Rash or bruise    I&Os  2140/1550 Trach yes   Data Review   CBC  Recent Labs Lab 06/10/13 0615 06/11/13 0550  WBC 14.1* 13.4*  HGB 9.9* 10.1*  HCT 30.9* 31.6*  PLT 465* 446*  MCV 93.4 93.5  MCH 29.9 29.9  MCHC 32.0 32.0  RDW 14.3 14.7    Chemistries   Recent Labs Lab 06/07/13 0500 06/10/13 0615 06/11/13 0550 06/12/13 0530  NA 133* 135* 133* 135*  K 4.5 4.0 4.4 4.6  CL 92* 93* 91* 93*  CO2 35* 32 33* 32  GLUCOSE 131* 136* 148* 115*  BUN 17 13 16 17   CREATININE 0.35* 0.29* 0.34* 0.41*  CALCIUM 8.5 8.7 8.7 8.8        Assessment & Plan   Resp Failure, status post tracheostomy tube placement today without problems continue with PSV trials B/L Asp Pna/HCAPS ; off IV  antibiotics Dysphagia continue with PEG tube feeding Hypernatremia resolved Deconditioning PT/ OT placed on hold Alcoholism continue with thiamine/folate  PCM continue with tube feeding and NG tube Bilateral pleural effusions status post thoracentesis Left Pneumothorax status post chest tube removed Leukocytosis, improving  Plan Continue same management for now. Patient has grave prognosis. CT of the abdomen and chest did not reveal any cancer.   Trying to reach his family to discuss CODE STATUS and comfort care with them   DVT Prophylaxis Heparin CODE STATUS: Full   Carron CurieAli Epimenio Schetter M.D on 06/12/2013 at 6:09 PM

## 2013-06-12 NOTE — Progress Notes (Signed)
  Echocardiogram 2D Echocardiogram has been performed.  Arvil ChacoRachel Foster 06/12/2013, 9:54 AM

## 2013-06-12 NOTE — Progress Notes (Signed)
Patient ID: Brandon FuchsFredrick L Skinner, male   DOB: [birth year], 68 y.o.   MRN: [id number] For comfort Will sign off  Mcarthur RossettiDaniel J. Tyson AliasFeinstein, MD, FACP Pgr: [phone] [place] [facility]

## 2013-06-13 NOTE — Progress Notes (Signed)
[facility]                                                                                              Progress note     Patient Demographics  Brandon Skinner, is a 68 y.o. male  ZOX:[id number]  WJX:[id number]:[id number]  DOB - 1945-07-07  Admit date - 05/18/2013  Admitting Physician Carron CurieAli Isobel Eisenhuth, MD  Outpatient Primary MD for the patient is PROVIDER NOT IN SYSTEM  LOS - 26   CC   Resp Failure   Dysphagia     Aspiration pneumonia    Pneumothorax      Subjective:   Brandon Skinner confused today and cannot give any history  Objective:   Vital signs  Temperature 97.5 Heart rate  115 Respiratory rate  16 Blood pressure  102/75 Pulse ox  100 %     Exam Alert awake , confused [place].AT,PERRAL NGT In Place Supple Neck,No JVD, No cervical lymphadenopathy appriciated. ET tube in place Symmetrical Chest wall movement, decreased breath sounds bilaterally especially at the bases RRR,No Gallops,Rubs or new Murmurs, No Parasternal Heave +ve B.Sounds, Abd Soft, Periumbilical Tenderness, No organomegaly appriciated, No rebound - guarding or rigidity. No Cyanosis, Clubbing or edema, No new Rash or bruise    I&Os  2040/1550 Trach yes   Data Review   CBC  Recent Labs Lab 06/10/13 0615 06/11/13 0550  WBC 14.1* 13.4*  HGB 9.9* 10.1*  HCT 30.9* 31.6*  PLT 465* 446*  MCV 93.4 93.5  MCH 29.9 29.9  MCHC 32.0 32.0  RDW 14.3 14.7    Chemistries   Recent Labs Lab 06/07/13 0500 06/10/13 0615 06/11/13 0550 06/12/13 0530  NA 133* 135* 133* 135*  K 4.5 4.0 4.4 4.6  CL 92* 93* 91* 93*  CO2 35* 32 33* 32  GLUCOSE 131* 136* 148* 115*  BUN 17 13 16 17   CREATININE 0.35* 0.29* 0.34* 0.41*  CALCIUM 8.5 8.7 8.7 8.8        Assessment & Plan   Resp Failure, status post tracheostomy tube placement today without problems continue with PSV trials B/L Asp Pna/HCAPS ; off IV  antibiotics Dysphagia continue with PEG tube feeding Hypernatremia resolved Deconditioning PT/ OT placed on hold Alcoholism continue with thiamine/folate  PCM continue with tube feeding and NG tube Bilateral pleural effusions status post thoracentesis Left Pneumothorax status post chest tube removed Leukocytosis, improving  Plan  Discussed CODE STATUS with him today, still didn't make up his mind . Family contacted. Will probably arrange a video meeting with them if feasible.   DVT Prophylaxis Heparin CODE STATUS: Full   Carron CurieAli Jinger Middlesworth M.D on 06/13/2013 at 2:36 PM

## 2013-06-14 LAB — CULTURE, RESPIRATORY

## 2013-06-14 LAB — CULTURE, RESPIRATORY W GRAM STAIN: Special Requests: NORMAL

## 2013-06-14 NOTE — Progress Notes (Signed)
[facility]                                                                                              Progress note     Patient Demographics  Brandon Skinner, is a 68 y.o. male  ZOX:[id number]  WJX:[id number]  DOB - 05/19/1945  Admit date - 05/18/2013  Admitting Physician Carron CurieAli Hosie Sharman, MD  Outpatient Primary MD for the patient is PROVIDER NOT IN SYSTEM  LOS - 27   CC   Resp Failure   Dysphagia     Aspiration pneumonia    Pneumothorax      Subjective:   Brandon CoryFredrick Skinner confused today and cannot give any history  Objective:   Vital signs  Temperature 98.5 Heart rate  121 Respiratory rate  20 Blood pressure  100/74 Pulse ox  100 %     Exam Alert awake , confused [place].AT,PERRAL NGT In Place Supple Neck,No JVD, No cervical lymphadenopathy appriciated. ET tube in place Symmetrical Chest wall movement, decreased breath sounds bilaterally especially at the bases RRR,No Gallops,Rubs or new Murmurs, No Parasternal Heave +ve B.Sounds, Abd Soft, Periumbilical Tenderness, No organomegaly appriciated, No rebound - guarding or rigidity. No Cyanosis, Clubbing or edema, No new Rash or bruise    I&Os  1560/1750 Trach yes   Data Review   CBC  Recent Labs Lab 06/10/13 0615 06/11/13 0550  WBC 14.1* 13.4*  HGB 9.9* 10.1*  HCT 30.9* 31.6*  PLT 465* 446*  MCV 93.4 93.5  MCH 29.9 29.9  MCHC 32.0 32.0  RDW 14.3 14.7    Chemistries   Recent Labs Lab 06/10/13 0615 06/11/13 0550 06/12/13 0530  NA 135* 133* 135*  K 4.0 4.4 4.6  CL 93* 91* 93*  CO2 32 33* 32  GLUCOSE 136* 148* 115*  BUN 13 16 17   CREATININE 0.29* 0.34* 0.41*  CALCIUM 8.7 8.7 8.8        Assessment & Plan   Resp Failure, status post tracheostomy tube placement today without problems continue with PSV trials B/L Asp Pna/HCAPS ; PSA start IV cefepime Dysphagia continue with PEG tube  feeding Hypernatremia resolved Deconditioning PT/ OT placed on hold Alcoholism continue with thiamine/folate  PCM continue with tube feeding and NG tube Bilateral pleural effusions status post thoracentesis Left Pneumothorax status post chest tube removed Leukocytosis, improving  Plan Cefepime Discussed CODE STATUS with him today, still didn't make up his mind . Family contacted. Will probably arrange a video meeting with them if feasible.   DVT Prophylaxis Heparin CODE STATUS: Full   Carron CurieAli Windi Toro M.D on 06/14/2013 at 12:39 PM

## 2013-06-15 NOTE — Progress Notes (Signed)
[facility]                                                                                              Progress note     Patient Demographics  Brandon Skinner, is a 68 y.o. male  ION:[id number]  KGM:[id number]  DOB - 24-Apr-1945  Admit date - 05/18/2013  Admitting Physician Brandon CurieAli Edwinna Rochette, MD  Outpatient Primary MD for the patient is PROVIDER NOT IN SYSTEM  LOS - 28   CC   Resp Failure   Dysphagia     Aspiration pneumonia    Pneumothorax      Subjective:   Brandon Skinner confused today and cannot give any history  Objective:   Vital signs  Temperature 98.4 Heart rate  72 Respiratory rate  20 Blood pressure  98/68 Pulse ox  99%     Exam Alert awake , confused [place].AT,PERRAL NGT In Place Supple Neck,No JVD, No cervical lymphadenopathy appriciated. ET tube in place Symmetrical Chest wall movement, decreased breath sounds bilaterally especially at the bases RRR,No Gallops,Rubs or new Murmurs, No Parasternal Heave +ve B.Sounds, Abd Soft, Periumbilical Tenderness, No organomegaly appriciated, No rebound - guarding or rigidity. No Cyanosis, Clubbing or edema, No new Rash or bruise    I&Os  2270/1550 Trach yes   Data Review   CBC  Recent Labs Lab 06/10/13 0615 06/11/13 0550  WBC 14.1* 13.4*  HGB 9.9* 10.1*  HCT 30.9* 31.6*  PLT 465* 446*  MCV 93.4 93.5  MCH 29.9 29.9  MCHC 32.0 32.0  RDW 14.3 14.7    Chemistries   Recent Labs Lab 06/10/13 0615 06/11/13 0550 06/12/13 0530  NA 135* 133* 135*  K 4.0 4.4 4.6  CL 93* 91* 93*  CO2 32 33* 32  GLUCOSE 136* 148* 115*  BUN 13 16 17   CREATININE 0.29* 0.34* 0.41*  CALCIUM 8.7 8.7 8.8        Assessment & Plan   Resp Failure, status post tracheostomy tube placement today without problems continue with PSV trials B/L Asp Pna/HCAPS ; PSA on IV antibiotics Dysphagia continue with PEG tube feeding Hypernatremia  resolved Deconditioning PT/ OT placed on hold Alcoholism continue with thiamine/folate  PCM continue with tube feeding and NG tube Bilateral pleural effusions status post thoracentesis Left Pneumothorax status post chest tube removed Leukocytosis, improving  Plan IV antibiotics CODE STATUS pending   DVT Prophylaxis Heparin CODE STATUS: Full   Brandon CurieAli Cydnie Skinner M.D on 06/15/2013 at 1:12 PM

## 2013-06-16 ENCOUNTER — Other Ambulatory Visit (HOSPITAL_COMMUNITY): Payer: Medicare Other

## 2013-06-16 LAB — BASIC METABOLIC PANEL
BUN: 26 mg/dL — ABNORMAL HIGH (ref 6–23)
CALCIUM: 9 mg/dL (ref 8.4–10.5)
CO2: 39 mEq/L — ABNORMAL HIGH (ref 19–32)
Chloride: 88 mEq/L — ABNORMAL LOW (ref 96–112)
Creatinine, Ser: 0.36 mg/dL — ABNORMAL LOW (ref 0.50–1.35)
GFR calc Af Amer: >90 mL/min (ref >90)
GFR calc non Af Amer: >90 mL/min (ref >90)
Glucose, Bld: 121 mg/dL — ABNORMAL HIGH (ref 70–99)
Potassium: 3.9 mEq/L (ref 3.7–5.3)
Sodium: 137 mEq/L (ref 137–147)

## 2013-06-16 LAB — CBC
HCT: 28.8 % — ABNORMAL LOW (ref 39.0–52.0)
Hemoglobin: 9 g/dL — ABNORMAL LOW (ref 13.0–17.0)
MCH: 29.8 pg (ref 26.0–34.0)
MCHC: 31.3 g/dL (ref 30.0–36.0)
MCV: 95.4 fL (ref 78.0–100.0)
PLATELETS: 436 10*3/uL — AB (ref 150–400)
RBC: 3.02 MIL/uL — ABNORMAL LOW (ref 4.22–5.81)
RDW: 16 % — ABNORMAL HIGH (ref 11.5–15.5)
WBC: 9.9 10*3/uL (ref 4.0–10.5)

## 2013-06-16 NOTE — Progress Notes (Signed)
[facility]                                                                                              Progress note     Patient Demographics  Brandon Skinner, is a 68 y.o. male  ZOX:[id number]  WJX:[id number]  DOB - 24-Jun-1945  Admit date - 05/18/2013  Admitting Physician Carron CurieAli Makana Feigel, MD  Outpatient Primary MD for the patient is PROVIDER NOT IN SYSTEM  LOS - 29   CC   Resp Failure   Dysphagia     Aspiration pneumonia    Pneumothorax      Subjective:   Brandon Skinner feeling better  Objective:   Vital signs  Temperature 98 Heart rate  102 Respiratory rate  18 Blood pressure  96/71 Pulse ox  94%     Exam Alert awake , confused [place].AT,PERRAL NGT In Place Supple Neck,No JVD, No cervical lymphadenopathy appriciated. ET tube in place Symmetrical Chest wall movement, decreased breath sounds bilaterally especially at the bases RRR,No Gallops,Rubs or new Murmurs, No Parasternal Heave +ve B.Sounds, Abd Soft, Periumbilical Tenderness, No organomegaly appriciated, No rebound - guarding or rigidity. No Cyanosis, Clubbing or edema, No new Rash or bruise    I&Os  2070/1575 Trach yes   Data Review   CBC  Recent Labs Lab 06/10/13 0615 06/11/13 0550 06/16/13 0755  WBC 14.1* 13.4* 9.9  HGB 9.9* 10.1* 9.0*  HCT 30.9* 31.6* 28.8*  PLT 465* 446* 436*  MCV 93.4 93.5 95.4  MCH 29.9 29.9 29.8  MCHC 32.0 32.0 31.3  RDW 14.3 14.7 16.0*    Chemistries   Recent Labs Lab 06/10/13 0615 06/11/13 0550 06/12/13 0530 06/16/13 0755  NA 135* 133* 135* 137  K 4.0 4.4 4.6 3.9  CL 93* 91* 93* 88*  CO2 32 33* 32 39*  GLUCOSE 136* 148* 115* 121*  BUN 13 16 17  26*  CREATININE 0.29* 0.34* 0.41* 0.36*  CALCIUM 8.7 8.7 8.8 9.0        Assessment & Plan   Resp Failure, status post tracheostomy tube placement today without problems continue with PSV trials B/L Asp Pna/HCAPS ;  PSA on IV antibiotics Dysphagia continue with PEG tube feeding Hypernatremia resolved Deconditioning PT/ OT placed on hold Alcoholism continue with thiamine/folate  PCM continue with tube feeding and NG tube Bilateral pleural effusions status post thoracentesis Left Pneumothorax status post chest tube removed Leukocytosis, improving  Plan IV antibiotics CODE STATUS pending   DVT Prophylaxis Heparin CODE STATUS: Full   Carron CurieAli Gwendalynn Eckstrom M.D on 06/16/2013 at 1:34 PM

## 2013-06-17 LAB — CULTURE, BLOOD (ROUTINE X 2)
CULTURE: NO GROWTH
Culture: NO GROWTH

## 2013-06-18 NOTE — Progress Notes (Signed)
PULMONARY / CRITICAL CARE MEDICINE  Name: Brandon Skinner MRN: [id number] DOB: December 26, 1945    ADMISSION DATE:  05/18/2013 CONSULTATION DATE:  05/21/2103  REFERRING MD :  Carron CurieAli Hijazi  CHIEF COMPLAINT:  Short of breath  BRIEF PATIENT DESCRIPTION: 67 yr alcoholic initially undergoing detox at [facility]. [facility] pneumonia and hypoxia. Course was complicated by bilateral pleural effusions, s/p thoracentesis and L pneumothorax, s/p chest tube which is now removed. [facility]. PCCM consulted 05/20/2013 due to respiratory failure.  Made comfort care on 5/14 at which point PCCM signed off.  Reversed code status and PCCM was reconsulted.  VITAL SIGNS:  Reviewed  PHYSICAL EXAMINATION: General: No distress, appears malnourished, ventilated Neuro:  Awake, alert HEENT:  Tracheostomy intact Cardiovascular:  Regular, tachycardic  Lungs:  Bilateral air entry, rhonchi Abdomen:  Soft, bowel sounds present Musculoskeletal:  Decreased muscle bulk  Skin:  No rash   LABS: CBC  Recent Labs Lab 06/16/13 0755  WBC 9.9  HGB 9.0*  HCT 28.8*  PLT 436*   Coag's No results found for this basename: APTT, INR,  in the last 168 hours BMET  Recent Labs Lab 06/12/13 0530 06/16/13 0755  NA 135* 137  K 4.6 3.9  CL 93* 88*  CO2 32 39*  BUN 17 26*  CREATININE 0.41* 0.36*  GLUCOSE 115* 121*   Electrolytes  Recent Labs Lab 06/12/13 0530 06/16/13 0755  CALCIUM 8.8 9.0   Sepsis Markers No results found for this basename: LATICACIDVEN, PROCALCITON, O2SATVEN,  in the last 168 hours ABG No results found for this basename: PHART, PCO2ART, PO2ART,  in the last 168 hours Liver Enzymes No results found for this basename: AST, ALT, ALKPHOS, BILITOT, ALBUMIN,  in the last 168 hours Cardiac Enzymes No results found for this basename: TROPONINI, PROBNP,  in the last 168 hours Glucose No results found for this basename: GLUCAP,  in the last 168  hours  IMAGING:   Dg Chest Port 1 View  06/16/2013   CLINICAL DATA:  Respiratory failure  EXAM: PORTABLE CHEST - 1 VIEW  COMPARISON:  Portable exam 1631 hr compared to 06/10/2013  FINDINGS: Tip of tracheostomy tube projects 7.6 cm above carina.  RIGHT lung apex excluded.  Normal heart size, mediastinal contours and pulmonary vascularity.  Dependent pleural effusions bilaterally.  Question additional consolidation RIGHT lower lobe.  Calcified granuloma LEFT base.  Upper lungs clear.  No pneumothorax.  Bones demineralized.  IMPRESSION: Bibasilar pleural effusions with question additional consolidation in RIGHT lower lobe.  Little interval change.   Electronically Signed   By: Ulyses SouthwardMark  Boles M.D.   On: 06/16/2013 16:50   ASSESSMENT / PLAN:  Acute on chronic respiratory failure Aspiration pneumonia / HCAP Probable COPD Bilateral pleural effisions Tracheostomy status Iatrogenic left pneumothorax s/p chest tube placement and removal   Continuous ventilatory support  Wean per protocol as tolerated  Suspect may need chronic vent facility placement  Bronchodilators  No indication for systemic steroids  Abx per primary team  Aim for negative fluid balance  I have personally obtained history, examined patient, evaluated and interpreted laboratory and imaging results, reviewed medical records, formulated assessment / plan and placed orders.  Lonia FarberKonstantin Jancy Sprankle, MD Pulmonary and Critical Care Medicine [facility] Pager: [phone]  06/18/2013, 2:35 PM

## 2013-06-20 NOTE — Progress Notes (Signed)
PULMONARY / CRITICAL CARE MEDICINE  Name: Brandon Skinner MRN: [id number] DOB: 1945/05/03    ADMISSION DATE:  05/18/2013 CONSULTATION DATE:  05/21/2103  REFERRING MD :  Carron Curie  CHIEF COMPLAINT:  Short of breath  BRIEF PATIENT DESCRIPTION: 67 yr alcoholic initially undergoing detox at [facility]. Transferred to [facility] with aspiration pneumonia and hypoxia. Course was complicated by bilateral pleural effusions, s/p thoracentesis and L pneumothorax, s/p chest tube which is now removed. Transferred to [facility] on 4/19. PCCM consulted 05/20/2013 due to respiratory failure.  Made comfort care on 5/14 at which point PCCM signed off.  Reversed code status and PCCM was reconsulted.  INTERVAL HISTORY:  Continues to fail weaning  VITAL SIGNS:  Reviewed  PHYSICAL EXAMINATION: General: Cachectic Neuro:  Awake, alert HEENT:  Tracheostomy clean Cardiovascular:  Regular, tachycardic  Lungs:  Bilateral  Very diminished air entry Abdomen:  Soft, bowel sounds present Musculoskeletal:  Decreased muscle bulk  Skin:  No rash   LABS: CBC  Recent Labs Lab 06/16/13 0755  WBC 9.9  HGB 9.0*  HCT 28.8*  PLT 436*   Coag's No results found for this basename: APTT, INR,  in the last 168 hours BMET  Recent Labs Lab 06/16/13 0755  NA 137  K 3.9  CL 88*  CO2 39*  BUN 26*  CREATININE 0.36*  GLUCOSE 121*   Electrolytes  Recent Labs Lab 06/16/13 0755  CALCIUM 9.0   Sepsis Markers No results found for this basename: LATICACIDVEN, PROCALCITON, O2SATVEN,  in the last 168 hours ABG No results found for this basename: PHART, PCO2ART, PO2ART,  in the last 168 hours Liver Enzymes No results found for this basename: AST, ALT, ALKPHOS, BILITOT, ALBUMIN,  in the last 168 hours Cardiac Enzymes No results found for this basename: TROPONINI, PROBNP,  in the last 168 hours Glucose No results found for this basename: GLUCAP,  in the last 168 hours  IMAGING: No results found.  ASSESSMENT /  PLAN:  Acute on chronic respiratory failure Failure to wean Aspiration pneumonia / HCAP Probable COPD Bilateral pleural effisions Tracheostomy status Iatrogenic left pneumothorax s/p chest tube placement and removal   Continuous ventilatory support  Need chronic vent facility placement if continuous aggressive care is desired  May benefit from clarification of goals of care  Bronchodilators  No indication for systemic steroids  Abx per primary team  Aim for negative fluid balance  PCCM will sign off.  Please reconsult PRN.  I have personally obtained history, examined patient, evaluated and interpreted laboratory and imaging results, reviewed medical records, formulated assessment / plan and placed orders.  Lonia Farber, MD Pulmonary and Critical Care Medicine [facility] Pager: [phone]  06/20/2013, 9:55 AM

## 2013-06-21 LAB — BLOOD GAS, ARTERIAL
ACID-BASE EXCESS: 14.3 mmol/L — AB (ref 0.0–2.0)
Bicarbonate: 41.3 mEq/L — ABNORMAL HIGH (ref 20.0–24.0)
FIO2: 0.6 %
O2 SAT: 97.3 %
Patient temperature: 98.6
TCO2: 44.1 mmol/L (ref 0–100)
pCO2 arterial: 89.7 mmHg (ref 35.0–45.0)
pH, Arterial: 7.285 — ABNORMAL LOW (ref 7.350–7.450)
pO2, Arterial: 94.1 mmHg (ref 80.0–100.0)

## 2013-06-23 NOTE — Progress Notes (Signed)
[facility]                                                                                              Progress note     Patient Demographics  Brandon Skinner, is a 68 y.o. male  QKM:[id number]  FBX:[id number]  DOB - 1945/11/20  Admit date - 05/18/2013  Admitting Physician Carron Curie, MD  Outpatient Primary MD for the patient is PROVIDER NOT IN SYSTEM  LOS - 36   CC   Resp Failure   Dysphagia     Aspiration pneumonia    Pneumothorax      Subjective:   Brandon Skinner complains of pain especially around his neck denies chest pains or shortness of breath,  Objective:   Vital signs  Temperature 97.3 Heart rate  81 Respiratory rate  18 Blood pressure  114/71 Pulse ox  100 %     Exam Alert awake , confused .AT,PERRAL NGT In Place Supple Neck,No JVD, No cervical lymphadenopathy appriciated. ET tube in place Symmetrical Chest wall movement, decreased breath sounds bilaterally especially at the bases RRR,No Gallops,Rubs or new Murmurs, No Parasternal Heave +ve B.Sounds, Abd Soft, Periumbilical Tenderness, No organomegaly appriciated, No rebound - guarding or rigidity. No Cyanosis, Clubbing or edema, No new Rash or bruise    I&Os  2266/?? Trach yes   Data Review   CBC No results found for this basename: WBC, HGB, HCT, PLT, MCV, MCH, MCHC, RDW, NEUTRABS, LYMPHSABS, MONOABS, EOSABS, BASOSABS, BANDABS, BANDSABD,  in the last 168 hours  Chemistries  No results found for this basename: NA, K, CL, CO2, GLUCOSE, BUN, CREATININE, GFRCGP, CALCIUM, MG, AST, ALT, ALKPHOS, BILITOT,  in the last 168 hours      Assessment & Plan   Resp Failure, status post tracheostomy tube placement today without problems continue with PSV trials Failed ATC trials B/L Asp Pna/HCAPS ; PSA on IV antibiotics Dysphagia continue with PEG tube feeding Hypernatremia resolved Deconditioning PT/ OT  placed on hold Alcoholism continue with thiamine/folate  PCM continue with tube feeding and NG tube Bilateral pleural effusions status post thoracentesis Left Pneumothorax status post chest tube removed Leukocytosis, improving Pain; started on Duragesic patch and IV morphine  Plan  Start Duragesic patch Morphine scheduled DC OxyIR  IV antibiotics CODE STATUS DNR   DVT Prophylaxis Heparin CODE STATUS: DNR   Carron Curie M.D on 06/23/2013 at 11:11 AM

## 2013-06-24 ENCOUNTER — Institutional Professional Consult (permissible substitution) (HOSPITAL_COMMUNITY): Payer: Medicare Other

## 2013-06-24 LAB — BASIC METABOLIC PANEL
BUN: 19 mg/dL (ref 6–23)
CALCIUM: 8.7 mg/dL (ref 8.4–10.5)
CHLORIDE: 88 meq/L — AB (ref 96–112)
CO2: 38 mEq/L — ABNORMAL HIGH (ref 19–32)
CREATININE: 0.31 mg/dL — AB (ref 0.50–1.35)
GFR calc non Af Amer: >90 mL/min (ref >90)
Glucose, Bld: 108 mg/dL — ABNORMAL HIGH (ref 70–99)
Potassium: 3.6 mEq/L — ABNORMAL LOW (ref 3.7–5.3)
Sodium: 132 mEq/L — ABNORMAL LOW (ref 137–147)

## 2013-06-24 LAB — CBC
HEMATOCRIT: 26.4 % — AB (ref 39.0–52.0)
Hemoglobin: 8.7 g/dL — ABNORMAL LOW (ref 13.0–17.0)
MCH: 29.5 pg (ref 26.0–34.0)
MCHC: 33 g/dL (ref 30.0–36.0)
MCV: 89.5 fL (ref 78.0–100.0)
Platelets: 560 10*3/uL — ABNORMAL HIGH (ref 150–400)
RBC: 2.95 MIL/uL — ABNORMAL LOW (ref 4.22–5.81)
RDW: 15.9 % — AB (ref 11.5–15.5)
WBC: 11.5 10*3/uL — AB (ref 4.0–10.5)

## 2013-06-24 NOTE — Progress Notes (Signed)
[facility]                                                                                              Progress note     Patient Demographics  Brandon Skinner, is a 68 y.o. male  KVQ:[id number]  IEP:[id number]  DOB - 02-10-1945  Admit date - 05/18/2013  Admitting Physician Carron Curie, MD  Outpatient Primary MD for the patient is PROVIDER NOT IN SYSTEM  LOS - 37   CC   Resp Failure   Dysphagia     Aspiration pneumonia    Pneumothorax      Subjective:   Brandon Skinner has no complaints today  Objective:   Vital signs  Temperature 96.1 Heart rate  91 Respiratory rate  into the Blood pressure  86/60 Pulse ox  100 %     Exam Alert awake , oriented [place].AT,PERRAL Supple Neck,No JVD, No cervical lymphadenopathy appriciated. Tracheostomy tube noted Symmetrical Chest wall movement, decreased breath sounds bilaterally especially at the bases RRR,No Gallops,Rubs or new Murmurs, No Parasternal Heave +ve B.Sounds, Abd Soft, Periumbilical Tenderness, No organomegaly appriciated, No rebound - guarding or rigidity. PEG tube noted No Cyanosis, Clubbing or edema, No new Rash or bruise    I&Os  unknown Trach yes   Data Review   CBC  Recent Labs Lab 06/24/13 0715  WBC 11.5*  HGB 8.7*  HCT 26.4*  PLT 560*  MCV 89.5  MCH 29.5  MCHC 33.0  RDW 15.9*    Chemistries   Recent Labs Lab 06/24/13 0715  NA 132*  K 3.6*  CL 88*  CO2 38*  GLUCOSE 108*  BUN 19  CREATININE 0.31*  CALCIUM 8.7        Assessment & Plan   Resp Failure, ventilator dependent continue with PSV trials B/L Asp Pna/HCAPS ; PSA on IV antibiotics Dysphagia continue with PEG tube feeding Hypernatremia resolved Deconditioning PT/ OT as tolerated  Alcoholism continue with thiamine/folate  PCM continue with tube feeding per PEG Bilateral pleural effusions status post thoracentesis Left  Pneumothorax status post chest tube removed Leukocytosis, improving Pain; started on Duragesic patch and IV morphine  Plan Continue same treatment Add steroids Add nebulizer treatments Consult pulmonary critical care  CODE STATUS FULL   DVT Prophylaxis Heparin CODE STATUS: Full   Carron Curie M.D on 06/24/2013 at 2:36 PM

## 2013-06-24 NOTE — Progress Notes (Signed)
PULMONARY / CRITICAL CARE MEDICINE  Name: CLAUDELL NOLDEN MRN: [id number] DOB: 09-17-1945    ADMISSION DATE:  05/18/2013 CONSULTATION DATE:  05/21/2103  REFERRING MD :  Carron Curie  CHIEF COMPLAINT:  Short of breath  BRIEF PATIENT DESCRIPTION: 67 yr alcoholic initially undergoing detox at [facility]. Transferred to [facility] with aspiration pneumonia and hypoxia. Course was complicated by bilateral pleural effusions, s/p thoracentesis and L pneumothorax, s/p chest tube which is now removed. Transferred to [facility] on 4/19. PCCM consulted 05/20/2013 due to respiratory failure.  Made comfort care on 5/14 at which point PCCM signed off.  Reversed code status and PCCM was reconsulted.  INTERVAL HISTORY:  Continues to fail weaning. Pain controlled but hurts everywhere   VITAL SIGNS:  Reviewed  PHYSICAL EXAMINATION: General: Cachectic Neuro:  Awake, alert HEENT:  Tracheostomy clean Cardiovascular:  Regular, tachycardic  Lungs:  Bilateral  Very diminished air entry Abdomen:  Soft, bowel sounds present Musculoskeletal:  Decreased muscle bulk  Skin:  No rash   LABS: CBC  Recent Labs Lab 06/24/13 0715  WBC 11.5*  HGB 8.7*  HCT 26.4*  PLT 560*   Coag's No results found for this basename: APTT, INR,  in the last 168 hours BMET  Recent Labs Lab 06/24/13 0715  NA 132*  K 3.6*  CL 88*  CO2 38*  BUN 19  CREATININE 0.31*  GLUCOSE 108*   Electrolytes  Recent Labs Lab 06/24/13 0715  CALCIUM 8.7   Sepsis Markers No results found for this basename: LATICACIDVEN, PROCALCITON, O2SATVEN,  in the last 168 hours ABG  Recent Labs Lab 06/21/13 1725  PHART 7.285*  PCO2ART 89.7*  PO2ART 94.1   Liver Enzymes No results found for this basename: AST, ALT, ALKPHOS, BILITOT, ALBUMIN,  in the last 168 hours Cardiac Enzymes No results found for this basename: TROPONINI, PROBNP,  in the last 168 hours Glucose No results found for this basename: GLUCAP,  in the last 168 hours  IMAGING: Dg  Chest Port 1 View  06/24/2013   CLINICAL DATA:  Respiratory failure  EXAM: PORTABLE CHEST - 1 VIEW  COMPARISON:  Prior chest x-ray 06/16/2013  FINDINGS: Stable cardiac and mediastinal contours. Slightly increased pulmonary vascular congestion. Very similar appearance of right greater than left bibasilar opacities representing a combination of pleural effusion with atelectasis and/ or consolidation. No pneumothorax. No acute osseous abnormality.  IMPRESSION: 1. Slightly increased pulmonary vascular congestion but no overt edema. 2. Similar appearance of bilateral right larger than left layering effusions with superimposed atelectasis and/or consolidation. 3. Stable position of tracheostomy tube.   Electronically Signed   By: Malachy Moan M.D.   On: 06/24/2013 08:12    ASSESSMENT / PLAN:  Acute on chronic respiratory failure Failure to wean Aspiration pneumonia / HCAP Probable COPD Tracheostomy status Iatrogenic left pneumothorax s/p chest tube placement and removal  Discussion Very weak. Primary issue is mal-nutrition and deconditioning. Not sure that there is any treatment to add in addition to what we are already doing in addition to time.   Plan  Continuous ventilatory support  Think he will likely need vent/SNF  OOB  Bronchodilators  No indication for systemic steroids  Abx per primary team  Aim for negative fluid balance  Consider tod sprinting TC if not brady , for now , PS 10-12 for 4-5 hrs goals   06/24/2013, 3:02 PM  I have fully examined this patient and agree with above findings.     Mcarthur Rossetti. Tyson Alias, MD, FACP Pgr:  [place]

## 2013-06-26 LAB — BLOOD GAS, ARTERIAL
Acid-Base Excess: 15 mmol/L — ABNORMAL HIGH (ref 0.0–2.0)
BICARBONATE: 39.7 meq/L — AB (ref 20.0–24.0)
FIO2: 40 %
LHR: 14 {breaths}/min
MECHVT: 550 mL
O2 SAT: 94.1 %
PATIENT TEMPERATURE: 98.6
PEEP/CPAP: 5 cmH2O
TCO2: 41.3 mmol/L (ref 0–100)
pCO2 arterial: 54.1 mmHg — ABNORMAL HIGH (ref 35.0–45.0)
pH, Arterial: 7.478 — ABNORMAL HIGH (ref 7.350–7.450)
pO2, Arterial: 68.4 mmHg — ABNORMAL LOW (ref 80.0–100.0)

## 2013-06-26 NOTE — Progress Notes (Signed)
PULMONARY / CRITICAL CARE MEDICINE  Name: Brandon Skinner MRN: [id number] DOB: August 31, 1945    ADMISSION DATE:  05/18/2013 CONSULTATION DATE:  05/21/2103  REFERRING MD :  Carron Curie  CHIEF COMPLAINT:  Short of breath  BRIEF PATIENT DESCRIPTION: 67 yr alcoholic initially undergoing detox at [facility]. Transferred to [facility] with aspiration pneumonia and hypoxia. Course was complicated by bilateral pleural effusions, s/p thoracentesis and L pneumothorax, s/p chest tube which is now removed. Transferred to [facility] on 4/19. PCCM consulted 05/20/2013 due to respiratory failure.  Made comfort care on 5/14 at which point PCCM signed off.  Reversed code status and PCCM was reconsulted.  INTERVAL HISTORY:  Continues to fail weaning. Pain controlled but hurts everywhere   VITAL SIGNS:  Vital signs reviewed. Abnormal values will appear under impression plan section.    PHYSICAL EXAMINATION: General: Cachectic Neuro:  Awake, alert, no co of sob HEENT:  Tracheostomy clean Cardiovascular:  Regular, tachycardic  Lungs:  Bilateral  Very diminished air entry Abdomen:  Soft, bowel sounds present Musculoskeletal:  Decreased muscle bulk  Skin:  No rash   LABS: CBC  Recent Labs Lab 06/24/13 0715  WBC 11.5*  HGB 8.7*  HCT 26.4*  PLT 560*   Coag's No results found for this basename: APTT, INR,  in the last 168 hours BMET  Recent Labs Lab 06/24/13 0715  NA 132*  K 3.6*  CL 88*  CO2 38*  BUN 19  CREATININE 0.31*  GLUCOSE 108*   Electrolytes  Recent Labs Lab 06/24/13 0715  CALCIUM 8.7   Sepsis Markers No results found for this basename: LATICACIDVEN, PROCALCITON, O2SATVEN,  in the last 168 hours ABG  Recent Labs Lab 06/21/13 1725  PHART 7.285*  PCO2ART 89.7*  PO2ART 94.1   Liver Enzymes No results found for this basename: AST, ALT, ALKPHOS, BILITOT, ALBUMIN,  in the last 168 hours Cardiac Enzymes No results found for this basename: TROPONINI, PROBNP,  in the last 168  hours Glucose No results found for this basename: GLUCAP,  in the last 168 hours  IMAGING: No results found.  ASSESSMENT / PLAN:  Acute on chronic respiratory failure Failure to wean Aspiration pneumonia / HCAP Probable COPD Tracheostomy status Iatrogenic left pneumothorax s/p chest tube placement and removal  Discussion Very weak. Primary issue is mal-nutrition and deconditioning. Not sure that there is any treatment to add in addition to what we are already doing in addition to time. Bradycardia with weaning.  Plan  Continuous ventilatory support              Check abg on rest mode ro alkalosis, noted, reduce MV as able, may have slight contribution to apnea noted  Think he will likely need vent/SNF  OOB  Bronchodilators  No indication for systemic steroids  Abx per primary team  Wean per protocol limited by brady, dc Beta blocker  [place] will not have a functional quality from this, consider comfort care talks  Brett Canales Minor ACNP Adolph Pollack PCCM Pager [phone] till 3 pm If no answer page [phone] 06/26/2013, 1:41 PM   Mcarthur Rossetti. Tyson Alias, MD, FACP Pgr: [phone] [facility]

## 2013-06-26 NOTE — Progress Notes (Signed)
[facility]                                                                                              Progress note     Patient Demographics  Brandon Skinner, is a 68 y.o. male  ZJQ:[id number]  [id number]:[id number]  DOB - 02/26/1945  Admit date - 05/18/2013  Admitting Physician Carron Curie, MD  Outpatient Primary MD for the patient is PROVIDER NOT IN SYSTEM  LOS - 39   CC   Resp Failure   Dysphagia     Aspiration pneumonia    Pneumothorax      Subjective:   [name] has no complaints today  Objective:   Vital signs  Temperature 97.5 Heart rate  74 Respiratory rate 18 Blood pressure  101/64 Pulse ox  95%     Exam Alert awake , oriented [place].AT,PERRAL Supple Neck,No JVD, No cervical lymphadenopathy appriciated. Tracheostomy tube noted Symmetrical Chest wall movement, decreased breath sounds bilaterally especially at the bases RRR,No Gallops,Rubs or new Murmurs, No Parasternal Heave +ve B.Sounds, Abd Soft, Periumbilical Tenderness, No organomegaly appriciated, No rebound - guarding or rigidity. PEG tube noted No Cyanosis, Clubbing or edema, No new Rash or bruise    I&Os  2660/1225 Trach yes   Data Review   CBC  Recent Labs Lab 06/24/13 0715  WBC 11.5*  HGB 8.7*  HCT 26.4*  PLT 560*  MCV 89.5  MCH 29.5  MCHC 33.0  RDW 15.9*    Chemistries   Recent Labs Lab 06/24/13 0715  NA 132*  K 3.6*  CL 88*  CO2 38*  GLUCOSE 108*  BUN 19  CREATININE 0.31*  CALCIUM 8.7        Assessment & Plan   Resp Failure, ventilator dependent continue with PSV /ATC trials B/L Asp Pna/HCAPS ; PSA on IV antibiotics Dysphagia continue with PEG tube feeding Hypernatremia resolved Deconditioning PT/ OT as tolerated  Alcoholism continue with thiamine/folate  PCM continue with tube feeding per PEG Bilateral pleural effusions status post thoracentesis Left  Pneumothorax status post chest tube removed Leukocytosis, improving Pain; started on Duragesic patch and IV morphine  Plan Continue same treatment Continue ATC weaning trials  CODE STATUS FULL   DVT Prophylaxis Heparin CODE STATUS: Full   Carron Curie M.D on 06/26/2013 at 1:22 PM

## 2013-06-27 NOTE — Progress Notes (Signed)
[facility]                                                                                              Progress note     Patient Demographics  Brandon Skinner, is a 68 y.o. male  FMM:[id number]  [id number]  DOB - [birth year]  Admit date - 05/18/2013  Admitting Physician Brandon Curie, MD  Outpatient Primary MD for the patient is PROVIDER NOT IN SYSTEM  LOS - 40   CC   Resp Failure   Dysphagia     Aspiration pneumonia    Pneumothorax      Subjective:   Brandon Skinner has no new complaints today  Objective:   Vital signs  Temperature 98 Heart rate  88 Respiratory rate 28 Blood pressure  110/71 Pulse ox  100 %     Exam Alert awake , oriented [place].AT,PERRAL Supple Neck,No JVD, No cervical lymphadenopathy appriciated. Tracheostomy tube noted Symmetrical Chest wall movement, decreased breath sounds bilaterally especially at the bases RRR,No Gallops,Rubs or new Murmurs, No Parasternal Heave +ve B.Sounds, Abd Soft, Periumbilical Tenderness, No organomegaly appriciated, No rebound - guarding or rigidity. PEG tube noted No Cyanosis, Clubbing or edema, No new Rash or bruise    I&Os  unknown Trach yes   Data Review   CBC  Recent Labs Lab 06/24/13 0715  WBC 11.5*  HGB 8.7*  HCT 26.4*  PLT 560*  MCV 89.5  MCH 29.5  MCHC 33.0  RDW 15.9*    Chemistries   Recent Labs Lab 06/24/13 0715  NA 132*  K 3.6*  CL 88*  CO2 38*  GLUCOSE 108*  BUN 19  CREATININE 0.31*  CALCIUM 8.7        Assessment & Plan   Resp Failure, ventilator dependent continue with PSV /ATC trials B/L Asp Pna/HCAPS ; PSA on IV antibiotics Dysphagia continue with PEG tube feeding Hypernatremia resolved Deconditioning PT/ OT as tolerated  Alcoholism continue with thiamine/folate  PCM continue with tube feeding per PEG Bilateral pleural effusions status post thoracentesis Left  Pneumothorax status post chest tube removed Leukocytosis, improving Pain; started on Duragesic patch and IV morphine  Plan Continue same treatment Continue ATC weaning trials  CODE STATUS FULL   DVT Prophylaxis Heparin CODE STATUS: Full   Brandon Skinner M.D on 06/27/2013 at 3:55 PM

## 2013-06-28 NOTE — Progress Notes (Addendum)
[facility]                                                                                              Progress note     Patient Demographics  Brandon Skinner, is a 68 y.o. male  ULA:[id number]  [id number]  DOB - May 23, 1945  Admit date - 05/18/2013  Admitting Physician Carron Curie, MD  Outpatient Primary MD for the patient is PROVIDER NOT IN SYSTEM  LOS - 41   CC   Resp Failure   Dysphagia     Aspiration pneumonia    Pneumothorax      Subjective:   Brandon Skinner has no new complaints today  Objective:   Vital signs  Temperature 98.1 Heart rate  72 Respiratory rate 17 Blood pressure  103/67 Pulse ox  100 %     Exam Alert awake , oriented [place].AT,PERRAL Supple Neck,No JVD, No cervical lymphadenopathy appriciated. Tracheostomy tube noted Symmetrical Chest wall movement, decreased breath sounds bilaterally especially at the bases RRR,No Gallops,Rubs or new Murmurs, No Parasternal Heave +ve B.Sounds, Abd Soft, Periumbilical Tenderness, No organomegaly appriciated, No rebound - guarding or rigidity. PEG tube noted No Cyanosis, Clubbing or edema, No new Rash or bruise    I&Os  unknown Trach yes   Data Review   CBC  Recent Labs Lab 06/24/13 0715  WBC 11.5*  HGB 8.7*  HCT 26.4*  PLT 560*  MCV 89.5  MCH 29.5  MCHC 33.0  RDW 15.9*    Chemistries   Recent Labs Lab 06/24/13 0715  NA 132*  K 3.6*  CL 88*  CO2 38*  GLUCOSE 108*  BUN 19  CREATININE 0.31*  CALCIUM 8.7        Assessment & Plan   Resp Failure, ventilator dependent continue with PSV /ATC trials B/L Asp Pna/HCAPS ; PSA on IV antibiotics Dysphagia continue with PEG tube feeding Hypernatremia resolved Deconditioning PT/ OT as tolerated  Alcoholism continue with thiamine/folate  PCM continue with tube feeding per PEG Bilateral pleural effusions status post thoracentesis Left  Pneumothorax status post chest tube removed Leukocytosis, improving Pain; started on Duragesic patch and IV morphine  Plan Continue same treatment Continue ATC weaning trials Ventilatory management today Critical care time 33 minutes CODE STATUS FULL   DVT Prophylaxis Heparin CODE STATUS: Full   Carron Curie M.D on 06/28/2013 at 12:19 PM

## 2013-06-29 ENCOUNTER — Other Ambulatory Visit (HOSPITAL_COMMUNITY): Payer: Medicare Other

## 2013-06-29 LAB — BASIC METABOLIC PANEL
BUN: 35 mg/dL — AB (ref 6–23)
CO2: 38 mEq/L — ABNORMAL HIGH (ref 19–32)
Calcium: 9 mg/dL (ref 8.4–10.5)
Chloride: 88 mEq/L — ABNORMAL LOW (ref 96–112)
Creatinine, Ser: 0.3 mg/dL — ABNORMAL LOW (ref 0.50–1.35)
GFR calc non Af Amer: >90 mL/min (ref >90)
Glucose, Bld: 139 mg/dL — ABNORMAL HIGH (ref 70–99)
POTASSIUM: 3.4 meq/L — AB (ref 3.7–5.3)
Sodium: 133 mEq/L — ABNORMAL LOW (ref 137–147)

## 2013-06-29 LAB — CBC
HCT: 26.7 % — ABNORMAL LOW (ref 39.0–52.0)
Hemoglobin: 8.6 g/dL — ABNORMAL LOW (ref 13.0–17.0)
MCH: 29.3 pg (ref 26.0–34.0)
MCHC: 32.2 g/dL (ref 30.0–36.0)
MCV: 90.8 fL (ref 78.0–100.0)
Platelets: 510 10*3/uL — ABNORMAL HIGH (ref 150–400)
RBC: 2.94 MIL/uL — ABNORMAL LOW (ref 4.22–5.81)
RDW: 16.2 % — AB (ref 11.5–15.5)
WBC: 7 10*3/uL (ref 4.0–10.5)

## 2013-06-29 NOTE — Progress Notes (Signed)
[facility]                                                                                              Progress note     Patient Demographics  Brandon Skinner, is a 68 y.o. male  KKX:[id number]  [id number]  DOB - December 17, 1945  Admit date - 05/18/2013  Admitting Physician Carron Curie, MD  Outpatient Primary MD for the patient is PROVIDER NOT IN SYSTEM  LOS - 42   CC   Resp Failure   Dysphagia     Aspiration pneumonia    Pneumothorax      Subjective:   Brandon Skinner has no new complaints today  Objective:   Vital signs  Temperature 98.3 Heart rate  80 Respiratory rate 21 Blood pressure  110/71 Pulse ox  100 %     Exam Alert awake , oriented [place].AT,PERRAL Supple Neck,No JVD, No cervical lymphadenopathy appriciated. Tracheostomy tube noted Symmetrical Chest wall movement, decreased breath sounds bilaterally especially at the bases RRR,No Gallops,Rubs or new Murmurs, No Parasternal Heave +ve B.Sounds, Abd Soft, Periumbilical Tenderness, No organomegaly appriciated, No rebound - guarding or rigidity. 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    Differential diagnosis includes alcohol myopathy, neurological disorder ALS versus isolated myasthenia gravis    However  having an EMG is next to impossible in [place] with the patient's condition    B/L Asp Pna/HCAPS ; PSA on IV antibiotics Dysphagia continue with PEG tube feeding Hypernatremia resolved Deconditioning PT/ OT as tolerated  Alcoholism continue with thiamine/folate  PCM continue with tube feeding per PEG Bilateral pleural effusions status post thoracentesis Left Pneumothorax status post chest tube removed Leukocytosis, improving Pain; started on Duragesic patch and IV morphine  Plan Continue same treatment  check myasthenia panel  Ventilatory management today Critical care time CODE STATUS FULL   DVT Prophylaxis Heparin CODE STATUS: Full   Carron Curie M.D on 06/29/2013 at 3:10 PM

## 2013-06-30 ENCOUNTER — Other Ambulatory Visit (HOSPITAL_COMMUNITY): Payer: Medicare Other

## 2013-06-30 LAB — POTASSIUM: Potassium: 3.9 mEq/L (ref 3.7–5.3)

## 2013-06-30 NOTE — Progress Notes (Signed)
PULMONARY / CRITICAL CARE MEDICINE  Name: Brandon Skinner MRN: [id number] DOB: Jun 07, 1945    ADMISSION DATE:  05/18/2013 CONSULTATION DATE:  05/21/2103  REFERRING MD :  Carron CurieAli Hijazi  CHIEF COMPLAINT:  Short of breath  BRIEF PATIENT DESCRIPTION: 67 yr alcoholic initially undergoing detox at [facility]. [facility] and [facility]. Course was complicated by bilateral pleural effusions, s/p thoracentesis and L pneumothorax, s/p chest tube which is now removed. [facility] PCCM consulted 05/20/2013 due to respiratory failure.  Made comfort care on 5/14 at which point PCCM signed off.  Reversed code status and PCCM was reconsulted.  INTERVAL HISTORY:  Continues to fail weaning. Pain controlled but hurts everywhere   VITAL SIGNS:  Vital signs reviewed. Abnormal values will appear under impression plan section.    PHYSICAL EXAMINATION: General: Cachectic Neuro:  Awake, alert, no co of sob HEENT:  Tracheostomy clean Cardiovascular:  Regular, tachycardic  Lungs:  Bilateral  Very diminished air entry Abdomen:  Soft, bowel sounds present Musculoskeletal:  Decreased muscle bulk  Skin:  No rash   LABS: CBC  Recent Labs Lab 06/24/13 0715 06/29/13 0615  WBC 11.5* 7.0  HGB 8.7* 8.6*  HCT 26.4* 26.7*  PLT 560* 510*   Coag's No results found for this basename: APTT, INR,  in the last 168 hours BMET  Recent Labs Lab 06/24/13 0715 06/29/13 0615 06/30/13 0650  NA 132* 133*  --   K 3.6* 3.4* 3.9  CL 88* 88*  --   CO2 38* 38*  --   BUN 19 35*  --   CREATININE 0.31* 0.30*  --   GLUCOSE 108* 139*  --    Electrolytes  Recent Labs Lab 06/24/13 0715 06/29/13 0615  CALCIUM 8.7 9.0   Sepsis Markers No results found for this basename: LATICACIDVEN, PROCALCITON, O2SATVEN,  in the last 168 hours ABG  Recent Labs Lab 06/26/13 1400  PHART 7.478*  PCO2ART 54.1*  PO2ART 68.4*   Liver Enzymes No results found for this basename: AST,  ALT, ALKPHOS, BILITOT, ALBUMIN,  in the last 168 hours Cardiac Enzymes No results found for this basename: TROPONINI, PROBNP,  in the last 168 hours Glucose No results found for this basename: GLUCAP,  in the last 168 hours  IMAGING: Dg Chest Port 1 View  06/30/2013   CLINICAL DATA:  Check tracheostomy placement  EXAM: PORTABLE CHEST - 1 VIEW  COMPARISON:  06/29/2013  FINDINGS: Cardiac shadow is stable. Tracheostomy tube is again seen in satisfactory position. Bilateral pleural effusions are noted. Likely basilar atelectasis is noted. The effusion is somewhat greater on the right than the left.  IMPRESSION: Tracheostomy in satisfactory position.  Bilateral pleural effusions right greater than left.   Electronically Signed   By: Alcide CleverMark  Lukens M.D.   On: 06/30/2013 08:09   Dg Chest Port 1 View  06/29/2013   CLINICAL DATA:  Respiratory failure  EXAM: PORTABLE CHEST - 1 VIEW  COMPARISON:  06/24/2013, 06/16/2013  FINDINGS: Tracheostomy tube in satisfactory position. Bilateral small pleural effusions. Right basilar airspace disease unchanged in the prior exam. Small area of right upper lobe airspace disease. No pneumothorax. Stable cardiomediastinal silhouette. Unremarkable osseous structures.  IMPRESSION: 1. Bilateral small pleural effusions. Right lower lobe airspace disease and to a lesser degree right upper lobe airspace disease concerning for multilobar pneumonia.   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 Primary issue is mal-nutrition and deconditioning. Primary team questioning neuro-muscular disorder.   Plan  Continuous ventilatory support  F/u neuro-muscular w/up  Ck  phosphate  Continue weaning efforts and try in-line speaking valve   Think he will likely need vent/SNF  OOB  Bronchodilators    PCCM ATTENDING: I have interviewed and examined the patient and reviewed the database. I have formulated the assessment and plan as reflected in the note above with amendments made by me.   Seen with ACNP Alonza Smoker, MD;  PCCM service; Mobile [phone]

## 2013-07-01 NOTE — Progress Notes (Signed)
PULMONARY / CRITICAL CARE MEDICINE  Name: Brandon FuchsFredrick L Davoli MRN: [id number] DOB: 04/07/1945    ADMISSION DATE:  05/18/2013 CONSULTATION DATE:  05/21/2103  REFERRING MD :  Carron CurieAli Hijazi  CHIEF COMPLAINT:  Short of breath  BRIEF PATIENT DESCRIPTION: 67 yr alcoholic [facility]. Course was complicated by bilateral pleural effusions, s/p thoracentesis and L pneumothorax, s/p chest tube which is now removed. [facility] PCCM consulted 05/20/2013 due to respiratory failure.  Made comfort care on 5/14 at which point PCCM signed off.  Reversed code status and PCCM was reconsulted.  INTERVAL HISTORY:  Weaned on 5/5 but when placed on TC bradied down to 35 and nearly arrested.   VITAL SIGNS:  Vital signs reviewed. Abnormal values will appear under impression plan section.   PHYSICAL EXAMINATION: General: Cachectic Neuro:  Awake, alert, no co of sob HEENT:  Tracheostomy clean Cardiovascular:  Regular, tachycardic  Lungs:  Bilateral  Very diminished air entry Abdomen:  Soft, bowel sounds present Musculoskeletal:  Decreased muscle bulk  Skin:  No rash   LABS: CBC  Recent Labs Lab 06/29/13 0615  WBC 7.0  HGB 8.6*  HCT 26.7*  PLT 510*   Coag's No results found for this basename: APTT, INR,  in the last 168 hours BMET  Recent Labs Lab 06/29/13 0615 06/30/13 0650  NA 133*  --   K 3.4* 3.9  CL 88*  --   CO2 38*  --   BUN 35*  --   CREATININE 0.30*  --   GLUCOSE 139*  --    Electrolytes  Recent Labs Lab 06/29/13 0615  CALCIUM 9.0   Sepsis Markers No results found for this basename: LATICACIDVEN, PROCALCITON, O2SATVEN,  in the last 168 hours ABG  Recent Labs Lab 06/26/13 1400  PHART 7.478*  PCO2ART 54.1*  PO2ART 68.4*   Liver Enzymes No results found for this basename: AST, ALT, ALKPHOS, BILITOT, ALBUMIN,  in the last 168 hours Cardiac Enzymes No results found for  this basename: TROPONINI, PROBNP,  in the last 168 hours Glucose No results found for this basename: GLUCAP,  in the last 168 hours  IMAGING: Dg Chest Port 1 View  06/30/2013   CLINICAL DATA:  Check tracheostomy placement  EXAM: PORTABLE CHEST - 1 VIEW  COMPARISON:  06/29/2013  FINDINGS: Cardiac shadow is stable. Tracheostomy tube is again seen in satisfactory position. Bilateral pleural effusions are noted. Likely basilar atelectasis is noted. The effusion is somewhat greater on the right than the left.  IMPRESSION: Tracheostomy in satisfactory position.  Bilateral pleural effusions right greater than left.   Electronically Signed   By: Alcide CleverMark  Lukens M.D.   On: 06/30/2013 08:09  6/1: no sig change in R>L airspace disease/ ATX w/ element of effusion   ASSESSMENT / PLAN:  Acute on chronic respiratory failure Failure to wean Aspiration pneumonia / HCAP Probable COPD Tracheostomy status Iatrogenic left pneumothorax s/p chest tube placement and removal  Discussion Very weak. Primary issue is mal-nutrition and deconditioning. Primary team questioning neuro-muscular disorder.   Plan  Continuous ventilatory support, ok to progress with PS but no TC given day's event.  Suspect will need vent SNF if wishes to continue with full support  F/u neuro-muscular w/up  Ck phosphate  Think he will likely need vent/SNF  OOB  Bronchodilators  Pleural effusion noted on CXR, compared to 5/13 when patient also had a CT of the chest,  CXR appears the same, no indication for a thora at this point.  I spoke with the patient extensively today, explained clinical picture and that his chances of coming off the vent are very poor.  Initially he expressed that he does not wish to be on the ventilator forever but then when told that the alternative is death he agreed to be on the ventilator for his daughters.  Recommend placement in a vent SNF at this point as I doubt he will have anybody to take care of him at home  with a vent.  Alyson Reedy, M.D. [facility] Pulmonary/Critical Care Medicine. Pager: [phone]. After hours pager: [phone].

## 2013-07-02 ENCOUNTER — Ambulatory Visit (HOSPITAL_COMMUNITY)
Admission: AD | Admit: 2013-07-02 | Discharge: 2013-07-02 | Disposition: A | Discharge status: Home / Self Care | Payer: Medicare Other | Source: Other Acute Inpatient Hospital | Attending: Internal Medicine | Admitting: Internal Medicine

## 2013-07-02 DIAGNOSIS — J9600 Acute respiratory failure, unspecified whether with hypoxia or hypercapnia: Secondary | ICD-10-CM | POA: Insufficient documentation

## 2013-07-02 LAB — ACETYLCHOLINE RECEPTOR, BINDING: Acetylcholine Receptor Ab: <0.3 nmol/L (ref <=0.30)

## 2013-07-03 LAB — STRIATED MUSCLE ANTIBODY: Striated Muscle Ab: <1:40 {titer}

## 2015-02-07 IMAGING — DX DG CHEST 1V PORT
1 series · 1 of 1 positions shown · non-contrast
Comparison: 06/08/2013

CLINICAL DATA: Post chest tube removal, possible pneumothorax

EXAM:
PORTABLE CHEST - 1 VIEW

[portable]
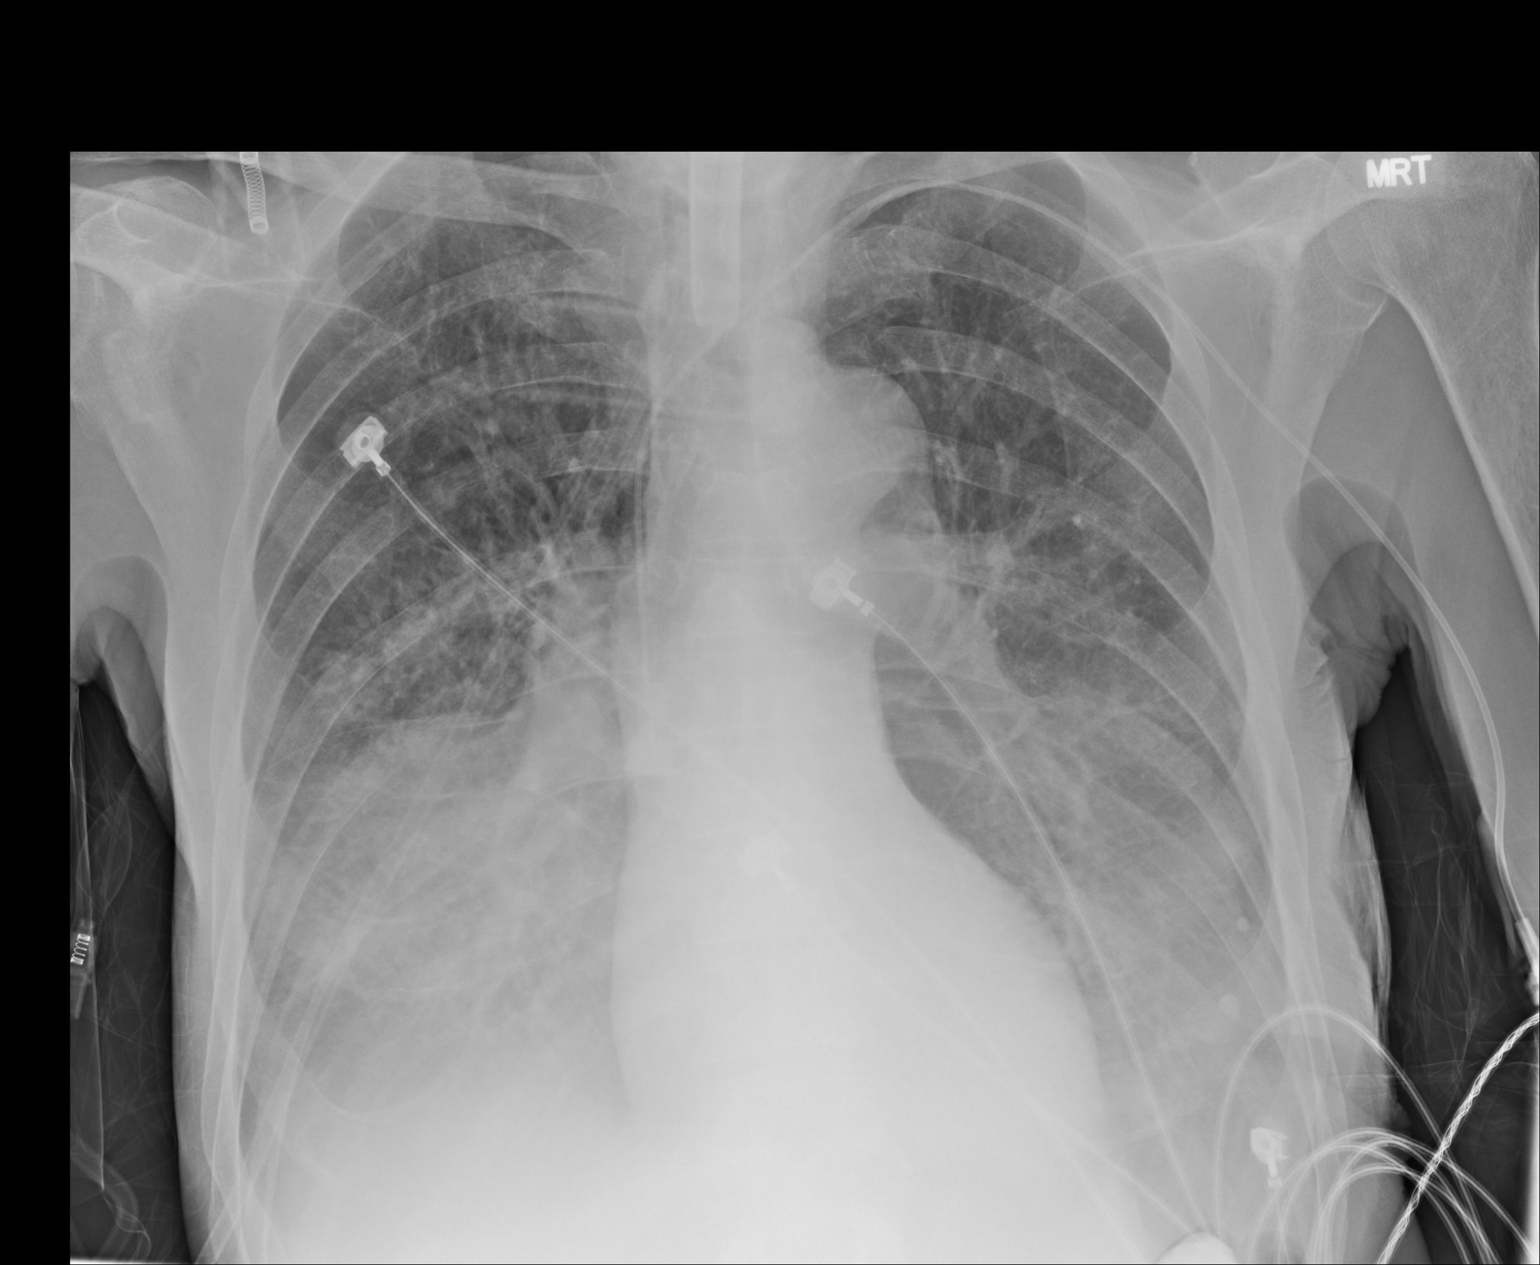

[1 of 1 positions shown; findings below may reference images not displayed]

FINDINGS: Left chest tube has been removed. No pneumothorax. Stable a left
PICC line position. Stable thoracostomy tube position. No pulmonary
edema. There is small bilateral pleural effusion. Hazy bilateral
lower lobe atelectasis or infiltrate.
IMPRESSION: Left chest tube has been removed. No pneumothorax. Small bilateral
pleural effusion. Hazy bilateral lower lobe atelectasis or
infiltrate.

## 2015-02-08 IMAGING — CT CT CHEST W/ CM
2 of 5 series · 12 of 36 positions shown, 15 images · IV contrast (APPLIED)
Comparison: CT chest from 05/12/2013.

CLINICAL DATA: Pleural effusion.  Dysphagia.  Ethanol abuse.

EXAM:
CT CHEST, ABDOMEN, AND PELVIS WITH CONTRAST
TECHNIQUE: Multidetector CT imaging of the chest, abdomen and pelvis was
performed following the standard protocol during bolus
administration of intravenous contrast.
CONTRAST:  100mL OMNIPAQUE IOHEXOL 300 MG/ML  SOLN

[Series 2: cap 5.0 i31f 1 · axial · 0.63mm/px · z∈[+881,+1496]mm · 9 of 141 slices shown, 12 images]
[im 9/141  mediastinal]
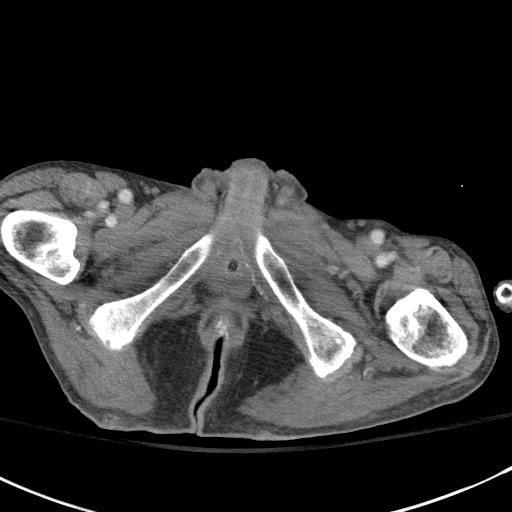
[im 9/141  lung]
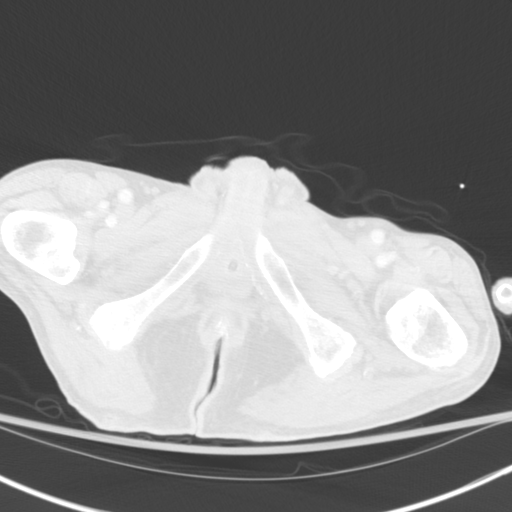
[im 27/141  lung]
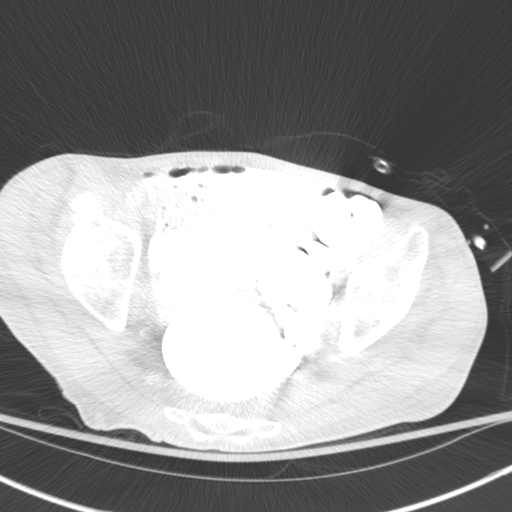
[im 44/141  lung]
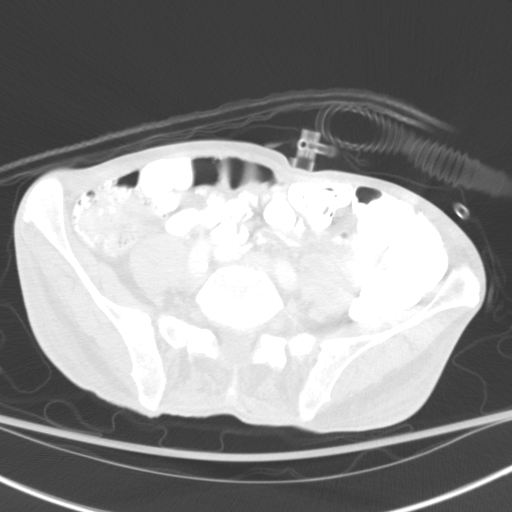
[im 53/141  lung]
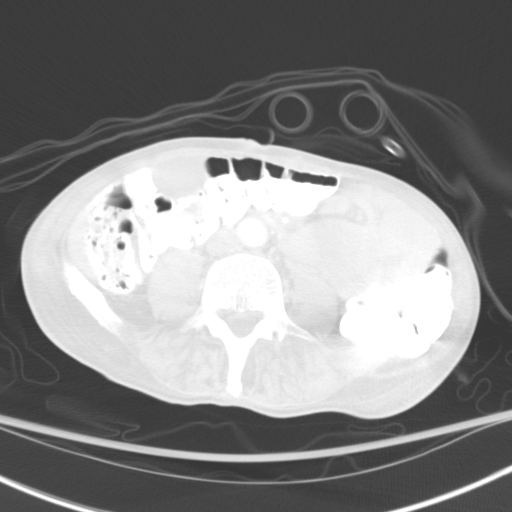
[im 71/141  mediastinal]
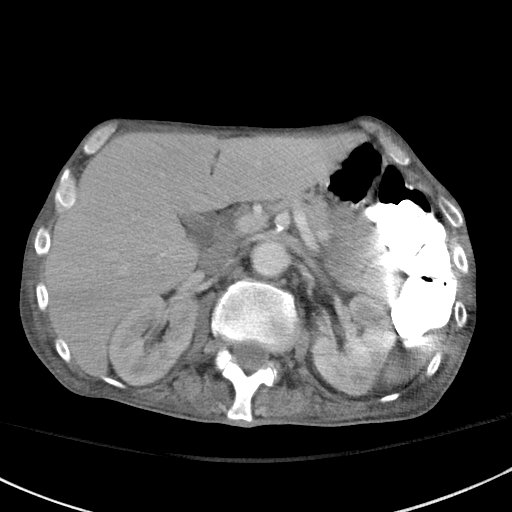
[im 71/141  lung]
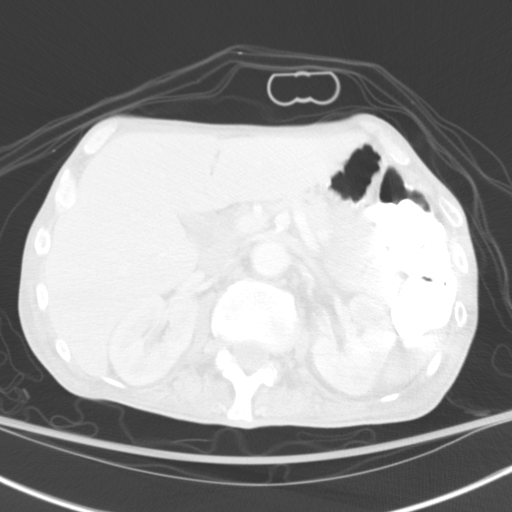
[im 88/141  lung]
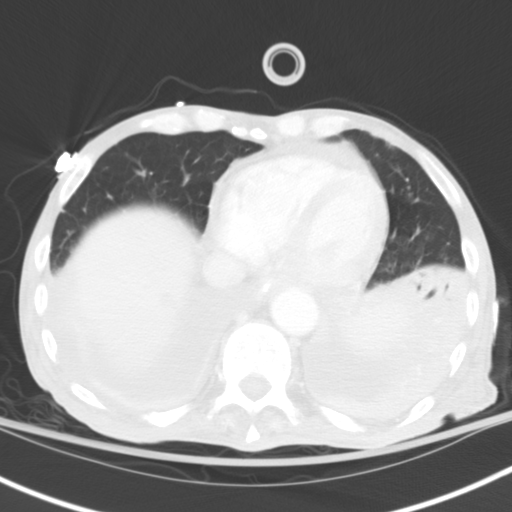
[im 97/141  lung]
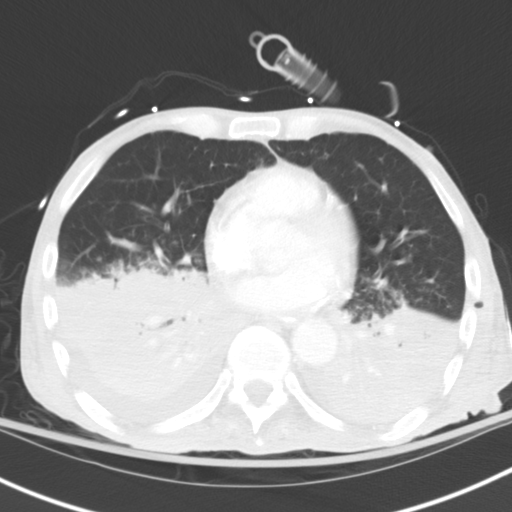
[im 114/141  lung]
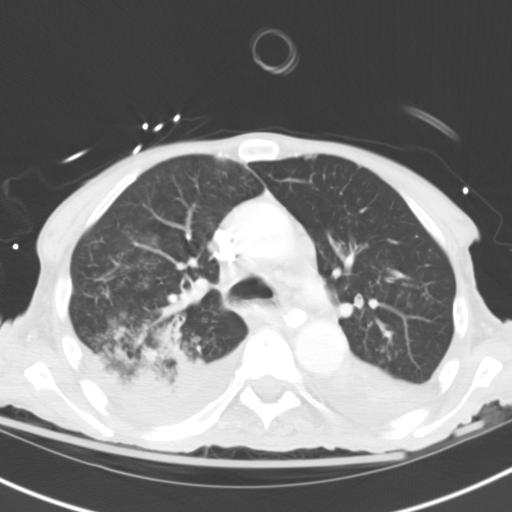
[im 132/141  mediastinal]
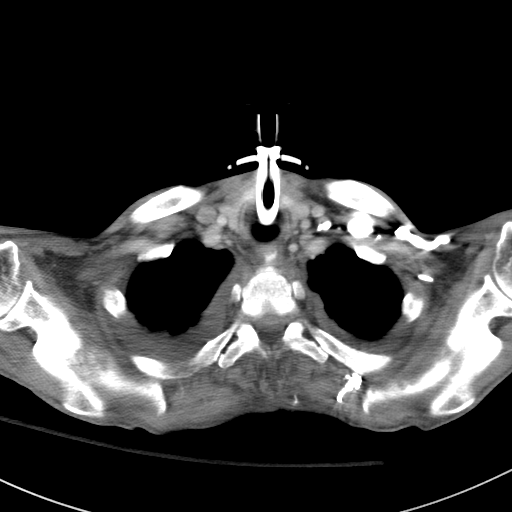
[im 132/141  lung]
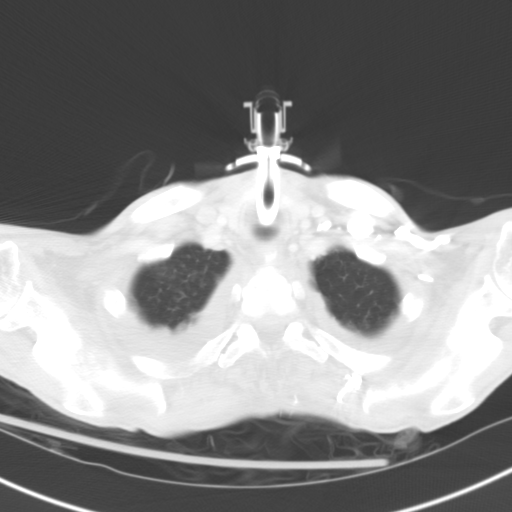

[Series 5: coronal · coronal · 0.71mm/px · 3 of 99 slices shown]
[im 20/99  lung]
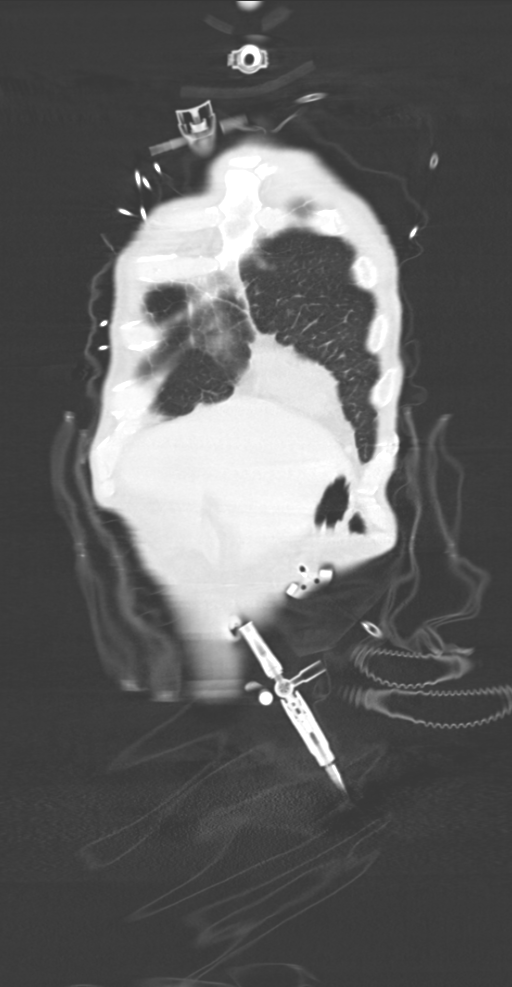
[im 40/99  lung]
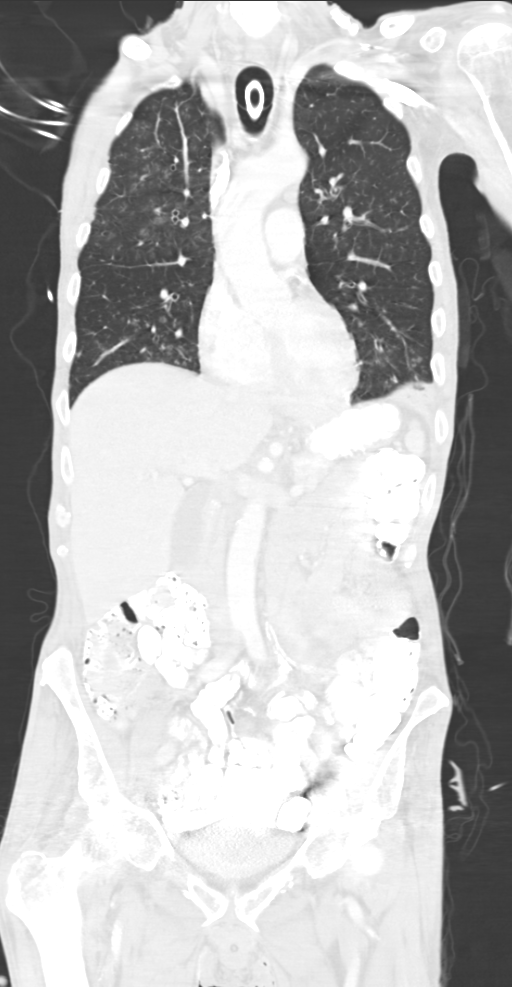
[im 59/99  lung]
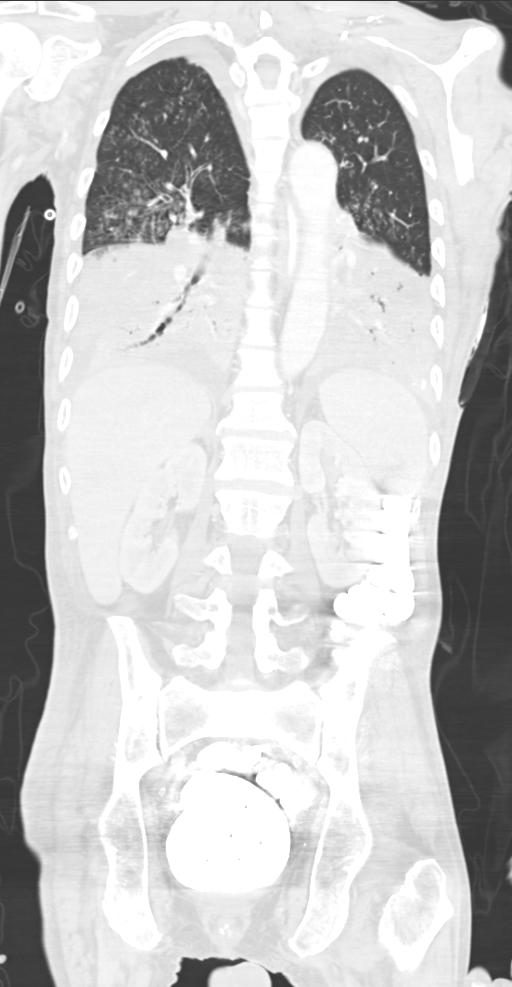

[12 of 36 positions shown; findings below may reference images not displayed]

FINDINGS: CT CHEST FINDINGS

Soft tissue / Mediastinum: There is no axillary lymphadenopathy.
Tracheostomy tube is visualized. Contrast material in the esophageal
lumen may be related to dysmotility or reflux. Increased number of
lymph nodes identified in the mediastinum without substantial
enlargement of any individual lymph node. One of the largest lymph
nodes is a 10 mm precarinal short axis lymph node. 11 mm short axis
right hilar lymph node is associated. The heart is normal in size.
There is trace anterior pericardial fluid or thickening.

Lungs / Pleura: There is been interval development of tree-in-bud
opacity in both upper lobes with associated interstitial thickening.
This gives way to more confluent airspace consolidation in the lower
lobes bilaterally. Small bilateral pleural effusions are associated.

Bones: Bone windows reveal no worrisome lytic or sclerotic osseous
lesions.

CT ABDOMEN AND PELVIS FINDINGS

Liver: 5 mm low-density lesion in the posterior right liver is too
small to characterize but likely represents a tiny cyst. 7 mm
low-density lesion in the hepatic dome on image 54 has attenuation
higher than would be expected for a simple cyst.

Spleen: Normal

Stomach: Gastrostomy tube noted in the distal stomach.

Pancreas: Mild diffuse distension of the main pancreatic duct noted
without evidence for pancreatic mass lesion.

Gallbladder/Biliary Tree: Gallbladder is distended without evidence
of stones. Common bile duct measures 5 mm in diameter at the level
of the pancreatic head.

Kidneys/Adrenals: No adrenal nodule or mass. No focal abnormality is
seen in either kidney. There is no hydronephrosis.

Bowel Loops: Duodenum is unremarkable. No small bowel dilatation.
Prominent stool volume noted throughout the colon with a patulous
rectum measuring up to 9.1 cm in diameter.

Nodes: No lymphadenopathy is seen in the abdomen. Assessment of
pelvic sidewalls is limited by the large volume enteric contrast in
the pelvic bowel loops.

Vasculature: Atherosclerotic calcification is noted in the wall of
the abdominal aorta without aneurysm.

Pelvic Genitourinary: Air in the urinary bladder is presumably
secondary to the presence of the Foley catheter.

Bones/Musculoskeletal: Bone windows reveal no worrisome lytic or
sclerotic osseous lesions.

Body Wall: No evidence for abdominal wall hernia.

Other: No gross intraperitoneal free fluid.
IMPRESSION: 1. Dense airspace consolidation in both lower lobes assisted with
more tree in bud opacity in the upper lobes. Imaging features are
compatible with diffuse bilateral pneumonia.
2. Small bilateral pleural effusions.
3. Borderline mediastinal lymphadenopathy.
4. Tiny low-density liver lesions too small to characterize. One of
these is probably a cyst. The of [REDACTED] be a complicated cyst or
hemangioma.
5. Large stool volume in the colon with a distended rectum measuring
up to 9.1 cm in diameter. Correlation for clinical signs/symptoms of
fecal impaction may prove helpful.
